# Patient Record
Sex: Male | Born: 1945 | Race: White | Hispanic: No | Marital: Married | Smoking: Never smoker
Health system: Southern US, Community
[De-identification: ages and names within clinical notes are randomized; demographics above are authoritative.]

## PROBLEM LIST (undated history)

## (undated) DIAGNOSIS — R7303 Prediabetes: Secondary | ICD-10-CM

## (undated) DIAGNOSIS — M75100 Unspecified rotator cuff tear or rupture of unspecified shoulder, not specified as traumatic: Secondary | ICD-10-CM

## (undated) DIAGNOSIS — M79643 Pain in unspecified hand: Secondary | ICD-10-CM

## (undated) DIAGNOSIS — M353 Polymyalgia rheumatica: Secondary | ICD-10-CM

## (undated) DIAGNOSIS — I10 Essential (primary) hypertension: Secondary | ICD-10-CM

## (undated) DIAGNOSIS — K0889 Other specified disorders of teeth and supporting structures: Secondary | ICD-10-CM

## (undated) HISTORY — PX: PATELLA FRACTURE SURGERY: SHX735

## (undated) HISTORY — PX: SEPTOPLASTY: SUR1290

## (undated) HISTORY — PX: CATARACT EXTRACTION: SUR2

## (undated) HISTORY — PX: EYE SURGERY: SHX253

---

## 2012-10-24 ENCOUNTER — Ambulatory Visit: Payer: Self-pay | Admitting: Unknown Physician Specialty

## 2012-10-28 LAB — PATHOLOGY REPORT

## 2012-11-12 DIAGNOSIS — H269 Unspecified cataract: Secondary | ICD-10-CM | POA: Insufficient documentation

## 2012-11-12 DIAGNOSIS — H52203 Unspecified astigmatism, bilateral: Secondary | ICD-10-CM | POA: Insufficient documentation

## 2012-12-24 DIAGNOSIS — I10 Essential (primary) hypertension: Secondary | ICD-10-CM | POA: Insufficient documentation

## 2013-10-14 DIAGNOSIS — E785 Hyperlipidemia, unspecified: Secondary | ICD-10-CM | POA: Insufficient documentation

## 2016-10-12 DIAGNOSIS — E119 Type 2 diabetes mellitus without complications: Secondary | ICD-10-CM | POA: Insufficient documentation

## 2017-10-07 ENCOUNTER — Other Ambulatory Visit: Payer: Self-pay | Admitting: Internal Medicine

## 2018-10-12 ENCOUNTER — Other Ambulatory Visit: Payer: Self-pay

## 2018-10-12 DIAGNOSIS — N390 Urinary tract infection, site not specified: Secondary | ICD-10-CM | POA: Insufficient documentation

## 2018-10-12 DIAGNOSIS — I10 Essential (primary) hypertension: Secondary | ICD-10-CM | POA: Diagnosis present

## 2018-10-12 DIAGNOSIS — Z79899 Other long term (current) drug therapy: Secondary | ICD-10-CM | POA: Diagnosis not present

## 2018-10-13 ENCOUNTER — Emergency Department
Admission: EM | Admit: 2018-10-13 | Discharge: 2018-10-13 | Disposition: A | Discharge status: Home / Self Care | Payer: Medicare Other | Attending: Emergency Medicine | Admitting: Emergency Medicine

## 2018-10-13 ENCOUNTER — Other Ambulatory Visit: Payer: Self-pay

## 2018-10-13 ENCOUNTER — Encounter: Payer: Self-pay | Admitting: Emergency Medicine

## 2018-10-13 ENCOUNTER — Emergency Department: Payer: Medicare Other

## 2018-10-13 DIAGNOSIS — I10 Essential (primary) hypertension: Secondary | ICD-10-CM

## 2018-10-13 DIAGNOSIS — N390 Urinary tract infection, site not specified: Secondary | ICD-10-CM

## 2018-10-13 HISTORY — DX: Essential (primary) hypertension: I10

## 2018-10-13 HISTORY — DX: Polymyalgia rheumatica: M35.3

## 2018-10-13 LAB — URINALYSIS, COMPLETE (UACMP) WITH MICROSCOPIC
Bilirubin Urine: NEGATIVE
Glucose, UA: 50 mg/dL — AB
Ketones, ur: NEGATIVE mg/dL
Nitrite: NEGATIVE
Protein, ur: NEGATIVE mg/dL
Specific Gravity, Urine: 1.013 (ref 1.005–1.030)
WBC, UA: >50 WBC/hpf — ABNORMAL HIGH (ref 0–5)
pH: 5 (ref 5.0–8.0)

## 2018-10-13 LAB — TROPONIN I
Troponin I: <0.03 ng/mL (ref <0.03)
Troponin I: <0.03 ng/mL (ref <0.03)

## 2018-10-13 LAB — CBC
HCT: 46.5 % (ref 39.0–52.0)
Hemoglobin: 15 g/dL (ref 13.0–17.0)
MCH: 29 pg (ref 26.0–34.0)
MCHC: 32.3 g/dL (ref 30.0–36.0)
MCV: 89.9 fL (ref 80.0–100.0)
Platelets: 200 10*3/uL (ref 150–400)
RBC: 5.17 MIL/uL (ref 4.22–5.81)
RDW: 13.2 % (ref 11.5–15.5)
WBC: 7.9 10*3/uL (ref 4.0–10.5)
nRBC: 0 % (ref 0.0–0.2)

## 2018-10-13 LAB — BASIC METABOLIC PANEL
Anion gap: 5 (ref 5–15)
BUN: 30 mg/dL — ABNORMAL HIGH (ref 8–23)
CO2: 25 mmol/L (ref 22–32)
Calcium: 8.7 mg/dL — ABNORMAL LOW (ref 8.9–10.3)
Chloride: 110 mmol/L (ref 98–111)
Creatinine, Ser: 0.82 mg/dL (ref 0.61–1.24)
GFR calc Af Amer: >60 mL/min (ref >60)
GFR calc non Af Amer: >60 mL/min (ref >60)
Glucose, Bld: 140 mg/dL — ABNORMAL HIGH (ref 70–99)
Potassium: 4 mmol/L (ref 3.5–5.1)
Sodium: 140 mmol/L (ref 135–145)

## 2018-10-13 MED ORDER — CEPHALEXIN 500 MG PO CAPS
500.0000 mg | ORAL_CAPSULE | Freq: Once | ORAL | Status: AC
  Administered 2018-10-13: 500 mg via ORAL
  Filled 2018-10-13: qty 1

## 2018-10-13 MED ORDER — LOSARTAN POTASSIUM-HCTZ 50-12.5 MG PO TABS: 1.0000 | ORAL_TABLET | Freq: Every day | ORAL | 0 refills | Status: AC

## 2018-10-13 MED ORDER — CEPHALEXIN 500 MG PO CAPS: 500.0000 mg | ORAL_CAPSULE | Freq: Three times a day (TID) | ORAL | 0 refills | Status: DC

## 2018-10-13 NOTE — ED Notes (Signed)

## 2018-10-13 NOTE — ED Notes (Signed)
Pt resting quietly and waiting patiently for treatment room; offered warm blanket but declined

## 2018-10-13 NOTE — Discharge Instructions (Addendum)
1.  Restart Losartan/HCTZ daily. 2.  Take antibiotic as prescribed (Keflex 500 mg 3 times daily x7 days). 3.  Return to the ER for worsening symptoms, persistent vomiting, difficulty breathing or other concerns.

## 2018-10-13 NOTE — ED Provider Notes (Signed)
[facility] Emergency Department Provider Note   ____________________________________________   First MD Initiated Contact with Patient 10/13/18 0451     (approximate)  I have reviewed the triage vital signs and the nursing notes.   HISTORY  Chief Complaint Hypertension and Numbness    HPI Charles Malone is a 73 y.o. male who presents to the ED from home with a chief complaint of elevated blood pressure.  Patient has been on prednisone taper since last fall for polymyalgia rheumatica.  He has intermittent tingling in his hands and joint pain.  He was on losartan/HCTZ but was taken off by his doctor after losing weight.  Patient states he has been putting on weight and took his blood pressure tonight which was 176/95.  States baseline systolic blood pressure in the 130s.  He did find an old blood pressure pill and took 1 along with 81 mg aspirin.  Patient denies headache, vision changes, chest pain, shortness of breath, abdominal pain, nausea or vomiting.  Denies extremity weakness.  Denies recent fever, cough, travel or exposure to persons diagnosed with coronavirus.       Past Medical History:  Diagnosis Date  . Hypertension   . Polymyalgia rheumatica (HCC)     There are no active problems to display for this patient.   Past Surgical History:  Procedure Laterality Date  . CATARACT EXTRACTION Bilateral   . PATELLA FRACTURE SURGERY Left     Prior to Admission medications   Medication Sig Start Date End Date Taking? Authorizing Provider  predniSONE (DELTASONE) 5 MG tablet Take 5 mg by mouth daily with breakfast.   Yes [provider]  losartan-hydrochlorothiazide (HYZAAR) 50-12.5 MG tablet Take 1 tablet by mouth daily. 10/13/18   Irean Hong, MD    Allergies Penicillins  History reviewed. No pertinent family history.  Social History Social History   Tobacco Use  . Smoking status: Never Smoker  . Smokeless tobacco: Never Used   Substance Use Topics  . Alcohol use: Never    Frequency: Never  . Drug use: Never    Review of Systems  Constitutional: No fever/chills Eyes: No visual changes. ENT: No sore throat. Cardiovascular: Denies chest pain. Respiratory: Denies shortness of breath. Gastrointestinal: No abdominal pain.  No nausea, no vomiting.  No diarrhea.  No constipation. Genitourinary: Negative for dysuria. Musculoskeletal: Negative for back pain. Skin: Negative for rash. Neurological: Negative for headaches, focal weakness or numbness. Rheumatological: Positive for occasional hand tingling and joint pain since last fall.   ____________________________________________   PHYSICAL EXAM:  VITAL SIGNS: ED Triage Vitals  Enc Vitals Group     BP 10/13/18 0014 (!) 175/94     Pulse Rate 10/13/18 0014 87     Resp 10/13/18 0014 17     Temp 10/13/18 0233 97.9 F (36.6 C)     Temp Source 10/13/18 0014 Oral     SpO2 10/13/18 0014 96 %     Weight 10/13/18 0016 203 lb (92.1 kg)     Height 10/13/18 0016 5' 8.5" (1.74 m)     Head Circumference --      Peak Flow --      Pain Score 10/13/18 0016 0     Pain Loc --      Pain Edu? --      Excl. in GC? --     Constitutional: Alert and oriented. Well appearing and in no acute distress. Eyes: Conjunctivae are normal. PERRL. EOMI. Head: Atraumatic. Nose:  No congestion/rhinnorhea. Mouth/Throat: Mucous membranes are moist.  Oropharynx non-erythematous. Neck: No stridor.   Cardiovascular: Normal rate, regular rhythm. Grossly normal heart sounds.  Good peripheral circulation. Respiratory: Normal respiratory effort.  No retractions. Lungs CTAB. Gastrointestinal: Soft and nontender. No distention. No abdominal bruits. No CVA tenderness. Musculoskeletal: No lower extremity tenderness nor edema.  No joint effusions. Neurologic:  Normal speech and language. No gross focal neurologic deficits are appreciated. No gait instability. Skin:  Skin is warm, dry and intact.  No rash noted. Psychiatric: Mood and affect are normal. Speech and behavior are normal.  ____________________________________________   LABS (all labs ordered are listed, but only abnormal results are displayed)  Labs Reviewed  BASIC METABOLIC PANEL - Abnormal; Notable for the following components:      Result Value   Glucose, Bld 140 (*)    BUN 30 (*)    Calcium 8.7 (*)    All other components within normal limits  URINALYSIS, COMPLETE (UACMP) WITH MICROSCOPIC - Abnormal; Notable for the following components:   Color, Urine YELLOW (*)    APPearance HAZY (*)    Glucose, UA 50 (*)    Hgb urine dipstick MODERATE (*)    Leukocytes,Ua LARGE (*)    WBC, UA >50 (*)    Bacteria, UA RARE (*)    All other components within normal limits  URINE CULTURE  CBC  TROPONIN I  TROPONIN I   ____________________________________________  EKG  ED ECG REPORT I, , J, the attending physician, personally viewed and interpreted this ECG.   Date: 10/13/2018  EKG Time: 0027  Rate: 76  Rhythm: normal EKG, normal sinus rhythm  Axis: Normal  Intervals:none  ST&T Change: Nonspecific  ____________________________________________  RADIOLOGY  ED MD interpretation: No acute cardiopulmonary process  Official radiology report(s): Dg Chest 2 View  Result Date: 10/13/2018 CLINICAL DATA:  Chest pain EXAM: CHEST - 2 VIEW COMPARISON:  None. FINDINGS: The heart size and mediastinal contours are within normal limits. Both lungs are clear. The visualized skeletal structures are unremarkable. IMPRESSION: No active cardiopulmonary disease. Electronically Signed   By: Deatra RobinsonKevin  Herman M.D.   On: 10/13/2018 01:24    ____________________________________________   PROCEDURES  Procedure(s) performed (including Critical Care):  Procedures   ____________________________________________   INITIAL IMPRESSION / ASSESSMENT AND PLAN / ED COURSE  As part of my medical decision making, I reviewed the  following data within the electronic MEDICAL RECORD NUMBER Nursing notes reviewed and incorporated, Labs reviewed, EKG interpreted, Old chart reviewed, Radiograph reviewed and Notes from prior ED visits     Jaquann R Theresia LoFitch was evaluated in Emergency Department on 10/13/2018 for the symptoms described in the history of present illness. He was evaluated in the context of the global COVID-19 pandemic, which necessitated consideration that the patient might be at risk for infection with the SARS-CoV-2 virus that causes COVID-19. Institutional protocols and algorithms that pertain to the evaluation of patients at risk for COVID-19 are in a state of rapid change based on information released by regulatory bodies including the CDC and federal and state organizations. These policies and algorithms were followed during the patient's care in the ED.   73 year old male who presents with elevated blood pressure.  Has been on prednisone taper since last fall for polymyalgia rheumatica.  Also has been steadily gaining weight.  Likely multifactorial contribution to elevated blood pressure.  Patient is neurologically intact without focal deficits.  Initial troponin EKG unremarkable.  Will repeat troponin, check urinalysis for infection.  Discussed with patient to restart losartan/HCTZ and follow-up closely with his PCP next week.  Clinical Course as of Oct 13 715  Mon Oct 13, 2018  0709 Repeat troponin negative.  Patient reports he has taken Keflex before without adverse reaction.  Will place on Keflex for UTI.  Restart Hyzaar and close follow-up with his PCP.  Strict return precautions even.  Patient verbalizes understanding and agrees with plan of care.   [JS]    Clinical Course User Index [JS] Irean Hong, MD     ____________________________________________   FINAL CLINICAL IMPRESSION(S) / ED DIAGNOSES  Final diagnoses:  Essential hypertension  Urinary tract infection without hematuria, site unspecified      ED Discharge Orders         Ordered    losartan-hydrochlorothiazide (HYZAAR) 50-12.5 MG tablet  Daily     10/13/18 0658           Note:  This document was prepared using Dragon voice recognition software and may include unintentional dictation errors.   Irean Hong, MD 10/13/18 [phone]

## 2018-10-13 NOTE — ED Triage Notes (Addendum)
Pt says he checked his blood pressure at home before bed because he'd been having some tingling in his left arm and both hands; pressure at home was 176/95; denies chest pain and headache; pt says he's been off BP meds for a few years after losing weight; pt says he did find some old Losartan/HCTZ and took one, along with 81mg  aspirin, before coming over;; pt talking in complete coherent sentences;

## 2018-10-13 NOTE — ED Notes (Signed)
Pt updated on delay for lab work turn around time. Pt verbalizes understanding.

## 2018-10-13 NOTE — ED Notes (Signed)
Report to alicia, rn.  

## 2018-10-14 DIAGNOSIS — M353 Polymyalgia rheumatica: Secondary | ICD-10-CM | POA: Insufficient documentation

## 2018-10-15 LAB — URINE CULTURE: Culture: NO GROWTH

## 2018-12-14 DIAGNOSIS — G5603 Carpal tunnel syndrome, bilateral upper limbs: Secondary | ICD-10-CM | POA: Insufficient documentation

## 2018-12-24 ENCOUNTER — Encounter
Admission: RE | Admit: 2018-12-24 | Discharge: 2018-12-24 | Disposition: A | Discharge status: Home / Self Care | Payer: Medicare Other | Source: Ambulatory Visit | Attending: Orthopedic Surgery | Admitting: Orthopedic Surgery

## 2018-12-24 ENCOUNTER — Other Ambulatory Visit: Payer: Self-pay

## 2018-12-24 HISTORY — DX: Pain in unspecified hand: M79.643

## 2018-12-24 HISTORY — DX: Other specified disorders of teeth and supporting structures: K08.89

## 2018-12-24 NOTE — Patient Instructions (Addendum)
Your procedure is scheduled on: 12/29/2018 Mon Report to Same Day Surgery 2nd floor medical mall [facility] Entrance-take elevator on left to 2nd floor.  Check in with surgery information desk.) To find out your arrival time please call [phone] between 1PM - 3PM on 12/26/2018 Fri  Remember: Instructions that are not followed completely may result in serious medical risk, up to and including death, or upon the discretion of your surgeon and anesthesiologist your surgery may need to be rescheduled.    _x___ 1. Do not eat food after midnight the night before your procedure. You may drink clear liquids up to 2 hours before you are scheduled to arrive at the hospital for your procedure.  Do not drink clear liquids within 2 hours of your scheduled arrival to the hospital.  Clear liquids include  --Water or Apple juice without pulp  --Clear carbohydrate beverage such as ClearFast or Gatorade  --Black Coffee or Clear Tea (No milk, no creamers, do not add anything to                  the coffee or Tea Type 1 and type 2 diabetics should only drink water.   ____Ensure clear carbohydrate drink on the way to the hospital for bariatric patients  __x__Ensure clear carbohydrate drink 3 hours before surgery.   No gum chewing or hard candies.     __x__ 2. No Alcohol for 24 hours before or after surgery.   __x__3. No Smoking or e-cigarettes for 24 prior to surgery.  Do not use any chewable tobacco products for at least 6 hour prior to surgery   ____  4. Bring all medications with you on the day of surgery if instructed.    __x__ 5. Notify your doctor if there is any change in your medical condition     (cold, fever, infections).    x___6. On the morning of surgery brush your teeth with toothpaste and water.  You may rinse your mouth with mouth wash if you wish.  Do not swallow any toothpaste or mouthwash.   Do not wear jewelry, make-up, hairpins, clips or nail polish.  Do not wear lotions,  powders, or perfumes. You may wear deodorant.  Do not shave 48 hours prior to surgery. Men may shave face and neck.  Do not bring valuables to the hospital.    [facility] is not responsible for any belongings or valuables.               Contacts, dentures or bridgework may not be worn into surgery.  Leave your suitcase in the car. After surgery it may be brought to your room.  For patients admitted to the hospital, discharge time is determined by your                       treatment team.  _  Patients discharged the day of surgery will not be allowed to drive home.  You will need someone to drive you home and stay with you the night of your procedure.    Please read over the following fact sheets that you were given:   [employer] [facility] [employer] Preparing for Surgery and or MRSA Information   _x___ Take anti-hypertensive listed below, cardiac, seizure, asthma,     anti-reflux and psychiatric medicines. These include:  1. predniSONE (DELTASONE) 1 MG tablet  2.  3.  4.  5.  6.  ____Fleets enema or Magnesium Citrate as directed.   _x___  Use CHG Soap or sage wipes as directed on instruction sheet   ____ Use inhalers on the day of surgery and bring to hospital day of surgery  ____ Stop Metformin and Janumet 2 days prior to surgery.    ____ Take 1/2 of usual insulin dose the night before surgery and none on the morning     surgery.   _x___ Follow recommendations from Cardiologist, Pulmonologist or PCP regarding          stopping Aspirin, Coumadin, Plavix ,Eliquis, Effient, or Pradaxa, and Pletal.  X____Stop Anti-inflammatories such as Advil, Aleve, Ibuprofen, Motrin, Naproxen, Naprosyn, Goodies powders or aspirin products. OK to take Tylenol and                          Celebrex.   _x___ Stop supplements until after surgery.  But may continue Vitamin D, Vitamin B,       and multivitamin.   ____ Bring C-Pap to the hospital.

## 2018-12-25 ENCOUNTER — Other Ambulatory Visit: Payer: Self-pay

## 2018-12-25 ENCOUNTER — Other Ambulatory Visit
Admission: RE | Admit: 2018-12-25 | Discharge: 2018-12-25 | Disposition: A | Discharge status: Home / Self Care | Payer: Medicare Other | Source: Ambulatory Visit | Attending: Orthopedic Surgery | Admitting: Orthopedic Surgery

## 2018-12-25 DIAGNOSIS — Z1159 Encounter for screening for other viral diseases: Secondary | ICD-10-CM | POA: Diagnosis present

## 2018-12-25 LAB — SARS CORONAVIRUS 2 (TAT 6-24 HRS): SARS Coronavirus 2: NEGATIVE

## 2018-12-28 ENCOUNTER — Encounter: Payer: Self-pay | Admitting: Orthopedic Surgery

## 2018-12-29 ENCOUNTER — Ambulatory Visit: Payer: Medicare Other | Admitting: Anesthesiology

## 2018-12-29 ENCOUNTER — Encounter
Admission: RE | Disposition: A | Discharge status: Home / Self Care | Payer: Self-pay | Source: Home / Self Care | Attending: Orthopedic Surgery

## 2018-12-29 ENCOUNTER — Encounter: Payer: Self-pay | Admitting: Anesthesiology

## 2018-12-29 ENCOUNTER — Ambulatory Visit
Admission: RE | Admit: 2018-12-29 | Discharge: 2018-12-29 | Disposition: A | Discharge status: Home / Self Care | Payer: Medicare Other | Attending: Orthopedic Surgery | Admitting: Orthopedic Surgery

## 2018-12-29 ENCOUNTER — Other Ambulatory Visit: Payer: Self-pay

## 2018-12-29 DIAGNOSIS — Z87891 Personal history of nicotine dependence: Secondary | ICD-10-CM | POA: Insufficient documentation

## 2018-12-29 DIAGNOSIS — E119 Type 2 diabetes mellitus without complications: Secondary | ICD-10-CM | POA: Insufficient documentation

## 2018-12-29 DIAGNOSIS — Z79899 Other long term (current) drug therapy: Secondary | ICD-10-CM | POA: Diagnosis not present

## 2018-12-29 DIAGNOSIS — G5603 Carpal tunnel syndrome, bilateral upper limbs: Secondary | ICD-10-CM | POA: Diagnosis present

## 2018-12-29 DIAGNOSIS — I10 Essential (primary) hypertension: Secondary | ICD-10-CM | POA: Diagnosis not present

## 2018-12-29 DIAGNOSIS — Z9889 Other specified postprocedural states: Secondary | ICD-10-CM

## 2018-12-29 HISTORY — PX: CARPAL TUNNEL RELEASE: SHX101

## 2018-12-29 SURGERY — CARPAL TUNNEL RELEASE
Anesthesia: General | Site: Wrist | Laterality: Left

## 2018-12-29 MED ORDER — LACTATED RINGERS IV SOLN
INTRAVENOUS | Status: DC
  Administered 2018-12-29: 17:00:00 via INTRAVENOUS

## 2018-12-29 MED ORDER — FAMOTIDINE 20 MG PO TABS
ORAL_TABLET | ORAL | Status: AC
  Filled 2018-12-29: qty 1

## 2018-12-29 MED ORDER — ONDANSETRON HCL 4 MG/2ML IJ SOLN
INTRAMUSCULAR | Status: DC | PRN
  Administered 2018-12-29: 4 mg via INTRAVENOUS

## 2018-12-29 MED ORDER — NEOMYCIN-POLYMYXIN B GU 40-200000 IR SOLN
Status: AC
  Filled 2018-12-29: qty 2

## 2018-12-29 MED ORDER — HYDROCODONE-ACETAMINOPHEN 5-325 MG PO TABS: 1.0000 | ORAL_TABLET | ORAL | 0 refills | Status: AC | PRN

## 2018-12-29 MED ORDER — FENTANYL CITRATE (PF) 100 MCG/2ML IJ SOLN
INTRAMUSCULAR | Status: AC
  Filled 2018-12-29: qty 2

## 2018-12-29 MED ORDER — FENTANYL CITRATE (PF) 100 MCG/2ML IJ SOLN: 25.0000 ug | INTRAMUSCULAR | Status: DC | PRN

## 2018-12-29 MED ORDER — PROPOFOL 10 MG/ML IV BOLUS
INTRAVENOUS | Status: AC
  Filled 2018-12-29: qty 20

## 2018-12-29 MED ORDER — FENTANYL CITRATE (PF) 100 MCG/2ML IJ SOLN
INTRAMUSCULAR | Status: DC | PRN
  Administered 2018-12-29 (×2): 50 ug via INTRAVENOUS

## 2018-12-29 MED ORDER — ONDANSETRON HCL 4 MG/2ML IJ SOLN: 4.0000 mg | Freq: Once | INTRAMUSCULAR | Status: DC | PRN

## 2018-12-29 MED ORDER — ACETAMINOPHEN 10 MG/ML IV SOLN
INTRAVENOUS | Status: DC | PRN
  Administered 2018-12-29: 1000 mg via INTRAVENOUS

## 2018-12-29 MED ORDER — CELECOXIB 200 MG PO CAPS
ORAL_CAPSULE | ORAL | Status: AC
  Filled 2018-12-29: qty 2

## 2018-12-29 MED ORDER — BUPIVACAINE HCL (PF) 0.25 % IJ SOLN
INTRAMUSCULAR | Status: DC | PRN
  Administered 2018-12-29: 10 mL

## 2018-12-29 MED ORDER — EPHEDRINE SULFATE 50 MG/ML IJ SOLN
INTRAMUSCULAR | Status: DC | PRN
  Administered 2018-12-29 (×2): 5 mg via INTRAVENOUS

## 2018-12-29 MED ORDER — HYDROCODONE-ACETAMINOPHEN 5-325 MG PO TABS
ORAL_TABLET | ORAL | Status: AC
  Filled 2018-12-29: qty 2

## 2018-12-29 MED ORDER — PROPOFOL 10 MG/ML IV BOLUS
INTRAVENOUS | Status: DC | PRN
  Administered 2018-12-29: 150 mg via INTRAVENOUS

## 2018-12-29 MED ORDER — CHLORHEXIDINE GLUCONATE 4 % EX LIQD: 60.0000 mL | Freq: Once | CUTANEOUS | Status: DC

## 2018-12-29 MED ORDER — LIDOCAINE HCL (CARDIAC) PF 100 MG/5ML IV SOSY
PREFILLED_SYRINGE | INTRAVENOUS | Status: DC | PRN
  Administered 2018-12-29: 100 mg via INTRAVENOUS

## 2018-12-29 MED ORDER — FAMOTIDINE 20 MG PO TABS
20.0000 mg | ORAL_TABLET | Freq: Once | ORAL | Status: AC
  Administered 2018-12-29: 16:00:00 20 mg via ORAL

## 2018-12-29 MED ORDER — NEOMYCIN-POLYMYXIN B GU 40-200000 IR SOLN
Status: DC | PRN
  Administered 2018-12-29: 2 mL

## 2018-12-29 MED ORDER — BUPIVACAINE HCL (PF) 0.25 % IJ SOLN
INTRAMUSCULAR | Status: AC
  Filled 2018-12-29: qty 30

## 2018-12-29 MED ORDER — HYDROCODONE-ACETAMINOPHEN 5-325 MG PO TABS
1.0000 | ORAL_TABLET | Freq: Once | ORAL | Status: AC
  Administered 2018-12-29: 2 via ORAL

## 2018-12-29 MED ORDER — CELECOXIB 200 MG PO CAPS
400.0000 mg | ORAL_CAPSULE | Freq: Once | ORAL | Status: AC
  Administered 2018-12-29: 400 mg via ORAL

## 2018-12-29 MED ORDER — PHENYLEPHRINE HCL (PRESSORS) 10 MG/ML IV SOLN
INTRAVENOUS | Status: DC | PRN
  Administered 2018-12-29: 200 ug via INTRAVENOUS

## 2018-12-29 MED ORDER — ACETAMINOPHEN 10 MG/ML IV SOLN
INTRAVENOUS | Status: AC
  Filled 2018-12-29: qty 100

## 2018-12-29 SURGICAL SUPPLY — 26 items
BNDG ELASTIC 3X5.8 VLCR NS LF (GAUZE/BANDAGES/DRESSINGS) ×2 IMPLANT
BNDG ESMARK 4X12 TAN STRL LF (GAUZE/BANDAGES/DRESSINGS) ×2 IMPLANT
CANISTER SUCT 1200ML W/VALVE (MISCELLANEOUS) ×2 IMPLANT
CAST PADDING 3X4FT ST 30246 (SOFTGOODS) ×1
COVER WAND RF STERILE (DRAPES) ×2 IMPLANT
CUFF TOURN SGL QUICK 18X4 (TOURNIQUET CUFF) ×2 IMPLANT
DRSG DERMACEA 8X12 NADH (GAUZE/BANDAGES/DRESSINGS) ×2 IMPLANT
DURAPREP 26ML APPLICATOR (WOUND CARE) ×2 IMPLANT
ELECT CAUTERY BLADE 6.4 (BLADE) ×2 IMPLANT
ELECT REM PT RETURN 9FT ADLT (ELECTROSURGICAL) ×2
ELECTRODE REM PT RTRN 9FT ADLT (ELECTROSURGICAL) ×1 IMPLANT
GAUZE SPONGE 4X4 12PLY STRL (GAUZE/BANDAGES/DRESSINGS) ×2 IMPLANT
GLOVE BIOGEL M STRL SZ7.5 (GLOVE) ×2 IMPLANT
GLOVE INDICATOR 8.0 STRL GRN (GLOVE) ×2 IMPLANT
GOWN STRL REUS W/ TWL LRG LVL3 (GOWN DISPOSABLE) ×2 IMPLANT
GOWN STRL REUS W/TWL LRG LVL3 (GOWN DISPOSABLE) ×2
KIT TURNOVER KIT A (KITS) ×2 IMPLANT
NS IRRIG 500ML POUR BTL (IV SOLUTION) ×2 IMPLANT
PACK EXTREMITY ARMC (MISCELLANEOUS) ×2 IMPLANT
PAD CAST CTTN 3X4 STRL (SOFTGOODS) ×1 IMPLANT
SOL PREP PVP 2OZ (MISCELLANEOUS) ×2
SOLUTION PREP PVP 2OZ (MISCELLANEOUS) ×1 IMPLANT
SPLINT CAST 1 STEP 3X12 (MISCELLANEOUS) ×2 IMPLANT
STOCKINETTE 48X4 2 PLY STRL (GAUZE/BANDAGES/DRESSINGS) ×1 IMPLANT
STOCKINETTE STRL 4IN 9604848 (GAUZE/BANDAGES/DRESSINGS) ×2 IMPLANT
SUT ETHILON 5-0 FS-2 18 BLK (SUTURE) ×2 IMPLANT

## 2018-12-29 NOTE — Anesthesia Postprocedure Evaluation (Signed)
Anesthesia Post Note  Patient: Charles Malone  Procedure(s) Performed: CARPAL TUNNEL RELEASE (Left Wrist)  Patient location during evaluation: PACU Anesthesia Type: General Level of consciousness: awake and alert Pain management: pain level controlled Vital Signs Assessment: post-procedure vital signs reviewed and stable Respiratory status: spontaneous breathing, nonlabored ventilation, respiratory function stable and patient connected to nasal cannula oxygen Cardiovascular status: blood pressure returned to baseline and stable Postop Assessment: no apparent nausea or vomiting Anesthetic complications: no     Last Vitals:  Vitals:   12/29/18 1858 12/29/18 1919  BP: (!) 168/77 (!) 167/74  Pulse: 75 74  Resp: 15 15  Temp: 36.8 C 36.4 C  SpO2: 95% 95%    Last Pain:  Vitals:   12/29/18 1919  TempSrc:   PainSc: 4                  Martha Clan

## 2018-12-29 NOTE — Op Note (Signed)
OPERATIVE NOTE  DATE OF SURGERY:  12/29/2018  PATIENT NAME:  Charles Malone   DOB: 10-03-1945  MRN: [id number]  PRE-OPERATIVE DIAGNOSIS: Left carpal tunnel syndrome  POST-OPERATIVE DIAGNOSIS:  Same  PROCEDURE:  Left carpal tunnel release  SURGEON:  Marciano Sequin. M.D.  ANESTHESIA: general  ESTIMATED BLOOD LOSS: Minimal  FLUIDS REPLACED: 500 mL of crystalloid  TOURNIQUET TIME: 32 minutes  DRAINS: None  INDICATIONS FOR SURGERY: VERYL WINEMILLER is a 73 y.o. year old male with a long history of numbness and paresthesias to both hands. EMG/nerve conduction studies demonstrated findings consistent with carpal tunnel syndrome.The patient had not seen any significant improvement despite conservative nonsurgical intervention. After discussion of the risks and benefits of surgical intervention, the patient expressed understanding of the risks benefits and agree with plans for carpal tunnel release.   PROCEDURE IN DETAIL: The patient was brought into the operating room and after adequate general anesthesia, a tourniquet was placed on the patient's left upper arm.The left hand and arm were prepped with alcohol and Duraprep and draped in the usual sterile fashion. A "time-out" was performed as per usual protocol. The hand and forearm were exsanguinated using an Esmarch and the tourniquet was inflated to 250 mmHg. Loupe magnification was used throughout the procedure. An incision was made just ulnar to the thenar palmar crease. Dissection was carried down through the palmar fascia to the transverse carpal ligament. The transverse carpal ligament was sharply incised, taking care to protect the underlying structures with the carpal tunnel. Complete release of the transverse carpal ligament was achieved. There was no evidence of ganglion cyst or lipoma within the carpal tunnel. The wound was irrigated with copious amounts of normal saline with antibiotic solution. The skin was then re-approximated with  interrupted sutures of #5-0 nylon. A sterile dressing was applied followed by application of a volar splint. The tourniquet was deflated with a total tourniquet time of 32 minutes.  The patient tolerated the procedure well and was transported to the PACU in stable condition.  James P. Holley Bouche., M.D.

## 2018-12-29 NOTE — Anesthesia Post-op Follow-up Note (Signed)
Anesthesia QCDR form completed.        

## 2018-12-29 NOTE — Transfer of Care (Signed)
Immediate Anesthesia Transfer of Care Note  Patient: Charles Malone  Procedure(s) Performed: CARPAL TUNNEL RELEASE (Left Wrist)  Patient Location: PACU  Anesthesia Type:General  Level of Consciousness: sedated  Airway & Oxygen Therapy: Patient Spontanous Breathing and Patient connected to face mask oxygen  Post-op Assessment: Report given to RN and Post -op Vital signs reviewed and stable  Post vital signs: Reviewed and stable  Last Vitals:  Vitals Value Taken Time  BP 161/65 12/29/18 1813  Temp    Pulse 83 12/29/18 1818  Resp 16 12/29/18 1813  SpO2 100 % 12/29/18 1818  Vitals shown include unvalidated device data.  Last Pain:  Vitals:   12/29/18 1813  TempSrc:   PainSc: Asleep         Complications: No apparent anesthesia complications

## 2018-12-29 NOTE — Discharge Instructions (Signed)
°  Instructions after Hand / Wrist Surgery ° ° James P. Hooten, Jr., M.D. ° Dept. of Orthopaedics & Sports Medicine ° [facility] ° [address] ° [place], [place]  [address] ° ° Phone: [phone]   Fax: [phone] ° ° °DIET: °• Drink plenty of non-alcoholic fluids & begin a light diet. °• Resume your normal diet the day after surgery. ° °ACTIVITY:  °• Keep the hand elevated above the level of the elbow. °• Begin gently moving the fingers on a regular basis to avoid stiffness. °• Avoid any heavy lifting, pushing, or pulling with the operative hand. °• Do not drive or operate any equipment until instructed. ° °WOUND CARE:  °• Keep the splint/bandage clean and dry.  °• The splint and stitches will be removed in the office. °• Continue to use the ice packs periodically to reduce pain and swelling. °• You may bathe or shower after the stitches are removed at the first office visit following surgery. ° °MEDICATIONS: °• You may resume your regular medications. °• Please take the pain medication as prescribed. °• Do not take pain medication on an empty stomach. °• Do not drive or drink alcoholic beverages when taking pain medications. ° °CALL THE OFFICE FOR: °• Temperature above 101 degrees °• Excessive bleeding or drainage on the dressing. °• Excessive swelling, coldness, or paleness of the fingers. °• Persistent nausea and vomiting. ° °FOLLOW-UP:  °• You should have an appointment to return to the office in 7-10 days after surgery.  ° °REMEMBER: R.I.C.E. = Rest, Ice, Compression, Elevation !  ° ° ° °AMBULATORY SURGERY  °DISCHARGE INSTRUCTIONS ° ° °1) The drugs that you were given will stay in your system until tomorrow so for the next 24 hours you should not: ° °A) Drive an automobile °B) Make any legal decisions °C) Drink any alcoholic beverage ° ° °2) You may resume regular meals tomorrow.  Today it is better to start with liquids and gradually work up to solid foods. ° °You may eat anything you prefer, but  it is better to start with liquids, then soup and crackers, and gradually work up to solid foods. ° ° °3) Please notify your doctor immediately if you have any unusual bleeding, trouble breathing, redness and pain at the surgery site, drainage, fever, or pain not relieved by medication. ° ° ° °4) Additional Instructions: ° ° ° ° ° ° ° °Please contact your physician with any problems or Same Day Surgery at [phone], Monday through Friday 6 am to 4 pm, or  at [place] Main number at [phone]. °

## 2018-12-29 NOTE — Anesthesia Procedure Notes (Signed)
Procedure Name: LMA Insertion Date/Time: 12/29/2018 5:11 PM Performed by: Leander Rams, CRNA Pre-anesthesia Checklist: Patient identified, Emergency Drugs available, Suction available, Patient being monitored and Timeout performed Patient Re-evaluated:Patient Re-evaluated prior to induction Oxygen Delivery Method: Circle system utilized Preoxygenation: Pre-oxygenation with 100% oxygen Induction Type: IV induction Ventilation: Mask ventilation without difficulty LMA: LMA inserted LMA Size: 4.0 Number of attempts: 1 Tube secured with: Tape

## 2018-12-29 NOTE — Anesthesia Preprocedure Evaluation (Signed)
Anesthesia Evaluation  Patient identified by MRN, date of birth, ID band Patient awake    Reviewed: Allergy & Precautions, H&P , NPO status , Patient's Chart, lab work & pertinent test results, reviewed documented beta blocker date and time   History of Anesthesia Complications Negative for: history of anesthetic complications  Airway Mallampati: I  TM Distance: >3 FB Neck ROM: full    Dental  (+) Caps, Dental Advidsory Given, Teeth Intact   Pulmonary neg pulmonary ROS,    Pulmonary exam normal        Cardiovascular Exercise Tolerance: Good hypertension, (-) angina(-) Past MI and (-) Cardiac Stents Normal cardiovascular exam(-) dysrhythmias (-) Valvular Problems/Murmurs     Neuro/Psych negative neurological ROS  negative psych ROS   GI/Hepatic negative GI ROS, Neg liver ROS,   Endo/Other  diabetes (borderline), Well Controlled  Renal/GU negative Renal ROS  negative genitourinary   Musculoskeletal   Abdominal   Peds  Hematology negative hematology ROS (+)   Anesthesia Other Findings Past Medical History: No date: Hand pain No date: Hypertension No date: Polymyalgia rheumatica (HCC) No date: Toothache     Comment:  Root canal on 12/25/2018   Reproductive/Obstetrics negative OB ROS                             Anesthesia Physical Anesthesia Plan  ASA: II  Anesthesia Plan: General   Post-op Pain Management:    Induction: Intravenous  PONV Risk Score and Plan: 2 and Ondansetron, Dexamethasone and Treatment may vary due to age or medical condition  Airway Management Planned: LMA  Additional Equipment:   Intra-op Plan:   Post-operative Plan: Extubation in OR  Informed Consent: I have reviewed the patients History and Physical, chart, labs and discussed the procedure including the risks, benefits and alternatives for the proposed anesthesia with the patient or authorized  representative who has indicated his/her understanding and acceptance.     Dental Advisory Given  Plan Discussed with: Anesthesiologist, CRNA and Surgeon  Anesthesia Plan Comments:         Anesthesia Quick Evaluation

## 2018-12-29 NOTE — H&P (Signed)
The patient has been re-examined, and the chart reviewed, and there have been no interval changes to the documented history and physical.    The risks, benefits, and alternatives have been discussed at length. The patient expressed understanding of the risks benefits and agreed with plans for surgical intervention.  Jorma Tassinari P. Cassandra Mcmanaman, Jr. M.D.    

## 2018-12-30 ENCOUNTER — Encounter: Payer: Self-pay | Admitting: Orthopedic Surgery

## 2019-02-16 ENCOUNTER — Other Ambulatory Visit: Payer: Self-pay

## 2019-02-16 ENCOUNTER — Encounter
Admission: RE | Admit: 2019-02-16 | Discharge: 2019-02-16 | Disposition: A | Discharge status: Home / Self Care | Payer: Medicare Other | Source: Ambulatory Visit | Attending: Orthopedic Surgery | Admitting: Orthopedic Surgery

## 2019-02-16 DIAGNOSIS — Z01818 Encounter for other preprocedural examination: Secondary | ICD-10-CM | POA: Insufficient documentation

## 2019-02-16 HISTORY — DX: Unspecified rotator cuff tear or rupture of unspecified shoulder, not specified as traumatic: M75.100

## 2019-02-16 HISTORY — DX: Prediabetes: R73.03

## 2019-02-16 NOTE — Patient Instructions (Signed)
Your procedure is scheduled on: Mon 9/21 Report to Day Surgery. To find out your arrival time please call [phone] between 1PM - 3PM on Friday 9/18.  Remember: Instructions that are not followed completely may result in serious medical risk,  up to and including death, or upon the discretion of your surgeon and anesthesiologist your  surgery may need to be rescheduled.     _X__ 1. Do not eat food after midnight the night before your procedure.                 No gum chewing or hard candies. You may drink clear liquids up to 2 hours                 before you are scheduled to arrive for your surgery- DO not drink clear                 liquids within 2 hours of the start of your surgery.                 Clear Liquids include:  water, apple juice without pulp, clear carbohydrate                 drink such as Clearfast of Gatorade, Black Coffee or Tea (Do not add                 anything to coffee or tea).  __X__2.  On the morning of surgery brush your teeth with toothpaste and water, you                may rinse your mouth with mouthwash if you wish.  Do not swallow any toothpaste of mouthwash.     __ 3.  No Alcohol for 24 hours before or after surgery.   ___ 4.  Do Not Smoke or use e-cigarettes For 24 Hours Prior to Your Surgery.                 Do not use any chewable tobacco products for at least 6 hours prior to                 surgery.  ____  5.  Bring all medications with you on the day of surgery if instructed.   _x___  6.  Notify your doctor if there is any change in your medical condition      (cold, fever, infections).     Do not wear jewelry, make-up, hairpins, clips or nail polish. Do not wear lotions, powders, or perfumes. You may wear deodorant. Do not shave 48 hours prior to surgery. Men may shave face and neck. Do not bring valuables to the hospital.    [facility] is not responsible for any belongings or valuables.  Contacts, dentures or  bridgework may not be worn into surgery. Leave your suitcase in the car. After surgery it may be brought to your room. For patients admitted to the hospital, discharge time is determined by your treatment team.   Patients discharged the day of surgery will not be allowed to drive home.   Please read over the following fact sheets that you were given:     __x__ Take these medicines the morning of surgery with A SIP OF WATER:    1. none  2.   3.   4.  5.  6.  ____ Fleet Enema (as directed)   _x___ Use CHG Soap as directed  ____ Use inhalers on the day  of surgery  ____ Stop metformin 2 days prior to surgery    ____ Take 1/2 of usual insulin dose the night before surgery. No insulin the morning          of surgery.   ____ Stop Coumadin/Plavix/aspirin on   ____ Stop Anti-inflammatories on    ____ Stop supplements until after surgery.    ____ Bring C-Pap to the hospital.

## 2019-02-19 ENCOUNTER — Other Ambulatory Visit
Admission: RE | Admit: 2019-02-19 | Discharge: 2019-02-19 | Disposition: A | Discharge status: Home / Self Care | Payer: Medicare Other | Source: Ambulatory Visit | Attending: Orthopedic Surgery | Admitting: Orthopedic Surgery

## 2019-02-19 ENCOUNTER — Other Ambulatory Visit: Payer: Self-pay

## 2019-02-19 DIAGNOSIS — Z20828 Contact with and (suspected) exposure to other viral communicable diseases: Secondary | ICD-10-CM | POA: Diagnosis not present

## 2019-02-19 DIAGNOSIS — Z01812 Encounter for preprocedural laboratory examination: Secondary | ICD-10-CM | POA: Diagnosis present

## 2019-02-19 LAB — SARS CORONAVIRUS 2 (TAT 6-24 HRS): SARS Coronavirus 2: NEGATIVE

## 2019-02-23 ENCOUNTER — Ambulatory Visit: Payer: Medicare Other | Admitting: Anesthesiology

## 2019-02-23 ENCOUNTER — Ambulatory Visit
Admission: RE | Admit: 2019-02-23 | Discharge: 2019-02-23 | Disposition: A | Discharge status: Home / Self Care | Payer: Medicare Other | Attending: Orthopedic Surgery | Admitting: Orthopedic Surgery

## 2019-02-23 ENCOUNTER — Other Ambulatory Visit: Payer: Self-pay

## 2019-02-23 ENCOUNTER — Encounter: Payer: Self-pay | Admitting: Orthopedic Surgery

## 2019-02-23 ENCOUNTER — Encounter
Admission: RE | Disposition: A | Discharge status: Home / Self Care | Payer: Self-pay | Source: Home / Self Care | Attending: Orthopedic Surgery

## 2019-02-23 DIAGNOSIS — Z7952 Long term (current) use of systemic steroids: Secondary | ICD-10-CM | POA: Insufficient documentation

## 2019-02-23 DIAGNOSIS — G5603 Carpal tunnel syndrome, bilateral upper limbs: Secondary | ICD-10-CM

## 2019-02-23 DIAGNOSIS — Z87891 Personal history of nicotine dependence: Secondary | ICD-10-CM | POA: Diagnosis not present

## 2019-02-23 DIAGNOSIS — E119 Type 2 diabetes mellitus without complications: Secondary | ICD-10-CM | POA: Insufficient documentation

## 2019-02-23 DIAGNOSIS — G5601 Carpal tunnel syndrome, right upper limb: Secondary | ICD-10-CM | POA: Insufficient documentation

## 2019-02-23 DIAGNOSIS — I10 Essential (primary) hypertension: Secondary | ICD-10-CM | POA: Insufficient documentation

## 2019-02-23 HISTORY — PX: CARPAL TUNNEL RELEASE: SHX101

## 2019-02-23 SURGERY — CARPAL TUNNEL RELEASE
Anesthesia: General | Site: Wrist | Laterality: Right

## 2019-02-23 MED ORDER — FAMOTIDINE 20 MG PO TABS
ORAL_TABLET | ORAL | Status: AC
  Filled 2019-02-23: qty 1

## 2019-02-23 MED ORDER — GLYCOPYRROLATE 0.2 MG/ML IJ SOLN
INTRAMUSCULAR | Status: AC
  Filled 2019-02-23: qty 1

## 2019-02-23 MED ORDER — OXYCODONE HCL 5 MG PO TABS: 5.0000 mg | ORAL_TABLET | Freq: Once | ORAL | Status: DC | PRN

## 2019-02-23 MED ORDER — CELECOXIB 200 MG PO CAPS
ORAL_CAPSULE | ORAL | Status: AC
  Filled 2019-02-23: qty 2

## 2019-02-23 MED ORDER — PROPOFOL 10 MG/ML IV BOLUS
INTRAVENOUS | Status: DC | PRN
  Administered 2019-02-23: 150 mg via INTRAVENOUS

## 2019-02-23 MED ORDER — CHLORHEXIDINE GLUCONATE 4 % EX LIQD: 60.0000 mL | Freq: Once | CUTANEOUS | Status: DC

## 2019-02-23 MED ORDER — ONDANSETRON HCL 4 MG/2ML IJ SOLN
INTRAMUSCULAR | Status: DC | PRN
  Administered 2019-02-23: 4 mg via INTRAVENOUS

## 2019-02-23 MED ORDER — OXYCODONE HCL 5 MG/5ML PO SOLN: 5.0000 mg | Freq: Once | ORAL | Status: DC | PRN

## 2019-02-23 MED ORDER — DEXAMETHASONE SODIUM PHOSPHATE 10 MG/ML IJ SOLN
INTRAMUSCULAR | Status: AC
  Filled 2019-02-23: qty 1

## 2019-02-23 MED ORDER — METOCLOPRAMIDE HCL 10 MG PO TABS: 5.0000 mg | ORAL_TABLET | Freq: Three times a day (TID) | ORAL | Status: DC | PRN

## 2019-02-23 MED ORDER — DEXAMETHASONE SODIUM PHOSPHATE 10 MG/ML IJ SOLN
INTRAMUSCULAR | Status: DC | PRN
  Administered 2019-02-23: 5 mg via INTRAVENOUS

## 2019-02-23 MED ORDER — PHENYLEPHRINE HCL (PRESSORS) 10 MG/ML IV SOLN
INTRAVENOUS | Status: AC
  Filled 2019-02-23: qty 1

## 2019-02-23 MED ORDER — LACTATED RINGERS IV SOLN
INTRAVENOUS | Status: DC
  Administered 2019-02-23 (×2): via INTRAVENOUS

## 2019-02-23 MED ORDER — NEOMYCIN-POLYMYXIN B GU 40-200000 IR SOLN
Status: DC | PRN
  Administered 2019-02-23: 2 mL

## 2019-02-23 MED ORDER — LIDOCAINE HCL (PF) 2 % IJ SOLN
INTRAMUSCULAR | Status: AC
  Filled 2019-02-23: qty 10

## 2019-02-23 MED ORDER — METOCLOPRAMIDE HCL 5 MG/ML IJ SOLN: 5.0000 mg | Freq: Three times a day (TID) | INTRAMUSCULAR | Status: DC | PRN

## 2019-02-23 MED ORDER — ONDANSETRON HCL 4 MG/2ML IJ SOLN
INTRAMUSCULAR | Status: AC
  Filled 2019-02-23: qty 2

## 2019-02-23 MED ORDER — ACETAMINOPHEN 10 MG/ML IV SOLN
INTRAVENOUS | Status: DC | PRN
  Administered 2019-02-23: 1000 mg via INTRAVENOUS

## 2019-02-23 MED ORDER — LIDOCAINE HCL (CARDIAC) PF 100 MG/5ML IV SOSY
PREFILLED_SYRINGE | INTRAVENOUS | Status: DC | PRN
  Administered 2019-02-23: 60 mg via INTRAVENOUS

## 2019-02-23 MED ORDER — ONDANSETRON HCL 4 MG PO TABS: 4.0000 mg | ORAL_TABLET | Freq: Four times a day (QID) | ORAL | Status: DC | PRN

## 2019-02-23 MED ORDER — FENTANYL CITRATE (PF) 100 MCG/2ML IJ SOLN
INTRAMUSCULAR | Status: DC | PRN
  Administered 2019-02-23 (×4): 25 ug via INTRAVENOUS

## 2019-02-23 MED ORDER — ONDANSETRON HCL 4 MG/2ML IJ SOLN: 4.0000 mg | Freq: Four times a day (QID) | INTRAMUSCULAR | Status: DC | PRN

## 2019-02-23 MED ORDER — PHENYLEPHRINE HCL (PRESSORS) 10 MG/ML IV SOLN
INTRAVENOUS | Status: DC | PRN
  Administered 2019-02-23 (×2): 100 ug via INTRAVENOUS
  Administered 2019-02-23: 150 ug via INTRAVENOUS
  Administered 2019-02-23: 100 ug via INTRAVENOUS

## 2019-02-23 MED ORDER — FENTANYL CITRATE (PF) 100 MCG/2ML IJ SOLN: 25.0000 ug | INTRAMUSCULAR | Status: DC | PRN

## 2019-02-23 MED ORDER — BUPIVACAINE HCL (PF) 0.25 % IJ SOLN
INTRAMUSCULAR | Status: DC | PRN
  Administered 2019-02-23: 10 mL

## 2019-02-23 MED ORDER — CELECOXIB 200 MG PO CAPS
400.0000 mg | ORAL_CAPSULE | Freq: Once | ORAL | Status: AC
  Administered 2019-02-23: 400 mg via ORAL

## 2019-02-23 MED ORDER — PROPOFOL 10 MG/ML IV BOLUS
INTRAVENOUS | Status: AC
  Filled 2019-02-23: qty 20

## 2019-02-23 MED ORDER — GLYCOPYRROLATE 0.2 MG/ML IJ SOLN
INTRAMUSCULAR | Status: DC | PRN
  Administered 2019-02-23: 0.2 mg via INTRAVENOUS

## 2019-02-23 MED ORDER — FAMOTIDINE 20 MG PO TABS
20.0000 mg | ORAL_TABLET | Freq: Once | ORAL | Status: AC
  Administered 2019-02-23: 15:00:00 20 mg via ORAL

## 2019-02-23 MED ORDER — FENTANYL CITRATE (PF) 100 MCG/2ML IJ SOLN
INTRAMUSCULAR | Status: AC
  Filled 2019-02-23: qty 2

## 2019-02-23 SURGICAL SUPPLY — 26 items
BNDG ELASTIC 3X5.8 VLCR NS LF (GAUZE/BANDAGES/DRESSINGS) ×3 IMPLANT
BNDG ESMARK 4X12 TAN STRL LF (GAUZE/BANDAGES/DRESSINGS) ×3 IMPLANT
CANISTER SUCT 1200ML W/VALVE (MISCELLANEOUS) ×3 IMPLANT
CAST PADDING 3X4FT ST 30246 (SOFTGOODS) ×2
COVER WAND RF STERILE (DRAPES) ×3 IMPLANT
CUFF TOURN SGL QUICK 18X4 (TOURNIQUET CUFF) ×3 IMPLANT
DRSG DERMACEA 8X12 NADH (GAUZE/BANDAGES/DRESSINGS) ×3 IMPLANT
DURAPREP 26ML APPLICATOR (WOUND CARE) ×3 IMPLANT
ELECT CAUTERY BLADE 6.4 (BLADE) ×3 IMPLANT
ELECT REM PT RETURN 9FT ADLT (ELECTROSURGICAL) ×3
ELECTRODE REM PT RTRN 9FT ADLT (ELECTROSURGICAL) ×1 IMPLANT
GAUZE SPONGE 4X4 12PLY STRL (GAUZE/BANDAGES/DRESSINGS) ×3 IMPLANT
GLOVE BIOGEL M STRL SZ7.5 (GLOVE) ×3 IMPLANT
GLOVE INDICATOR 8.0 STRL GRN (GLOVE) ×3 IMPLANT
GOWN STRL REUS W/ TWL LRG LVL3 (GOWN DISPOSABLE) ×2 IMPLANT
GOWN STRL REUS W/TWL LRG LVL3 (GOWN DISPOSABLE) ×4
KIT TURNOVER KIT A (KITS) ×3 IMPLANT
NS IRRIG 500ML POUR BTL (IV SOLUTION) ×3 IMPLANT
PACK EXTREMITY ARMC (MISCELLANEOUS) ×3 IMPLANT
PAD CAST CTTN 3X4 STRL (SOFTGOODS) ×1 IMPLANT
SOL PREP PVP 2OZ (MISCELLANEOUS) ×3
SOLUTION PREP PVP 2OZ (MISCELLANEOUS) ×1 IMPLANT
SPLINT CAST 1 STEP 3X12 (MISCELLANEOUS) ×3 IMPLANT
STOCKINETTE 48X4 2 PLY STRL (GAUZE/BANDAGES/DRESSINGS) ×1 IMPLANT
STOCKINETTE STRL 4IN 9604848 (GAUZE/BANDAGES/DRESSINGS) ×3 IMPLANT
SUT ETHILON 5-0 FS-2 18 BLK (SUTURE) ×3 IMPLANT

## 2019-02-23 NOTE — Discharge Instructions (Signed)
°  Instructions after Hand / Wrist Surgery ° ° James P. Hooten, Jr., M.D. ° Dept. of Orthopaedics & Sports Medicine ° [facility] ° [address] ° [place], [place]  [address] ° ° Phone: [phone]   Fax: [phone] ° ° °DIET: °• Drink plenty of non-alcoholic fluids & begin a light diet. °• Resume your normal diet the day after surgery. ° °ACTIVITY:  °• Keep the hand elevated above the level of the elbow. °• Begin gently moving the fingers on a regular basis to avoid stiffness. °• Avoid any heavy lifting, pushing, or pulling with the operative hand. °• Do not drive or operate any equipment until instructed. ° °WOUND CARE:  °• Keep the splint/bandage clean and dry.  °• The splint and stitches will be removed in the office. °• Continue to use the ice packs periodically to reduce pain and swelling. °• You may bathe or shower after the stitches are removed at the first office visit following surgery. ° °MEDICATIONS: °• You may resume your regular medications. °• Please take the pain medication as prescribed. °• Do not take pain medication on an empty stomach. °• Do not drive or drink alcoholic beverages when taking pain medications. ° °CALL THE OFFICE FOR: °• Temperature above 101 degrees °• Excessive bleeding or drainage on the dressing. °• Excessive swelling, coldness, or paleness of the fingers. °• Persistent nausea and vomiting. ° °FOLLOW-UP:  °• You should have an appointment to return to the office in 7-10 days after surgery.  ° °REMEMBER: R.I.C.E. = Rest, Ice, Compression, Elevation !  ° ° ° °AMBULATORY SURGERY  °DISCHARGE INSTRUCTIONS ° ° °1) The drugs that you were given will stay in your system until tomorrow so for the next 24 hours you should not: ° °A) Drive an automobile °B) Make any legal decisions °C) Drink any alcoholic beverage ° ° °2) You may resume regular meals tomorrow.  Today it is better to start with liquids and gradually work up to solid foods. ° °You may eat anything you prefer, but  it is better to start with liquids, then soup and crackers, and gradually work up to solid foods. ° ° °3) Please notify your doctor immediately if you have any unusual bleeding, trouble breathing, redness and pain at the surgery site, drainage, fever, or pain not relieved by medication. ° ° ° °4) Additional Instructions: ° ° ° ° ° ° ° °Please contact your physician with any problems or Same Day Surgery at [phone], Monday through Friday 6 am to 4 pm, or [place] at  Main number at [phone]. °

## 2019-02-23 NOTE — Anesthesia Post-op Follow-up Note (Signed)
Anesthesia QCDR form completed.        

## 2019-02-23 NOTE — Op Note (Signed)
OPERATIVE NOTE  DATE OF SURGERY:  02/23/2019  PATIENT NAME:  Charles Malone   DOB: [birth year]  MRN: [id number]  PRE-OPERATIVE DIAGNOSIS: Right carpal tunnel syndrome  POST-OPERATIVE DIAGNOSIS:  Same  PROCEDURE:  Right carpal tunnel release  SURGEON:  Marciano Sequin. M.D.  ANESTHESIA: general  ESTIMATED BLOOD LOSS: Minimal  FLUIDS REPLACED: 1000 mL of crystalloid  TOURNIQUET TIME: 25 minutes  DRAINS: None  INDICATIONS FOR SURGERY: DEUCE PATERNOSTER is a 73 y.o. year old male with a long history of numbness and paresthesias to the right hand. EMG/nerve conduction studies demonstrated findings consistent with carpal tunnel syndrome.The patient had not seen any significant improvement despite conservative nonsurgical intervention. After discussion of the risks and benefits of surgical intervention, the patient expressed understanding of the risks benefits and agree with plans for carpal tunnel release.   PROCEDURE IN DETAIL: The patient was brought into the operating room and after adequate general anesthesia, a tourniquet was placed on the patient's right upper arm.The right hand and arm were prepped with alcohol and Duraprep and draped in the usual sterile fashion. A "time-out" was performed as per usual protocol. The hand and forearm were exsanguinated using an Esmarch and the tourniquet was inflated to 250 mmHg. Loupe magnification was used throughout the procedure. An incision was made just ulnar to the thenar palmar crease. Dissection was carried down through the palmar fascia to the transverse carpal ligament. The transverse carpal ligament was sharply incised, taking care to protect the underlying structures with the carpal tunnel. Complete release of the transverse carpal ligament was achieved. There was no evidence of ganglion cyst or lipoma within the carpal tunnel. The wound was irrigated with copious amounts of normal saline with antibiotic solution. The skin was then  re-approximated with interrupted sutures of #5-0 nylon. A sterile dressing was applied followed by application of a volar splint. The tourniquet was deflated with a total tourniquet time of 25 minutes.  The patient tolerated the procedure well and was transported to the PACU in stable condition.   P. Holley Bouche., M.D.

## 2019-02-23 NOTE — Transfer of Care (Signed)
Immediate Anesthesia Transfer of Care Note  Patient: Charles Malone  Procedure(s) Performed: CARPAL TUNNEL RELEASE (Right Wrist)  Patient Location: PACU  Anesthesia Type:General  Level of Consciousness: sedated  Airway & Oxygen Therapy: Patient Spontanous Breathing and Patient connected to face mask oxygen  Post-op Assessment: Report given to RN and Post -op Vital signs reviewed and stable  Post vital signs: Reviewed and stable  Last Vitals:  Vitals Value Taken Time  BP 105/57 02/23/19 1707  Temp 36.7 C 02/23/19 1707  Pulse 80 02/23/19 1709  Resp 12 02/23/19 1709  SpO2 99 % 02/23/19 1709  Vitals shown include unvalidated device data.  Last Pain:  Vitals:   02/23/19 1707  TempSrc:   PainSc: 0-No pain         Complications: No apparent anesthesia complications

## 2019-02-23 NOTE — Anesthesia Procedure Notes (Signed)
Procedure Name: LMA Insertion Date/Time: 02/23/2019 4:12 PM Performed by: Dionne Bucy, CRNA Pre-anesthesia Checklist: Patient identified, Patient being monitored, Timeout performed, Emergency Drugs available and Suction available Patient Re-evaluated:Patient Re-evaluated prior to induction Oxygen Delivery Method: Circle system utilized Preoxygenation: Pre-oxygenation with 100% oxygen Induction Type: IV induction Ventilation: Mask ventilation without difficulty LMA: LMA inserted LMA Size: 4.0 Tube type: Oral Number of attempts: 1 Placement Confirmation: positive ETCO2 and breath sounds checked- equal and bilateral Tube secured with: Tape Dental Injury: Teeth and Oropharynx as per pre-operative assessment

## 2019-02-23 NOTE — H&P (Signed)
The patient has been re-examined, and the chart reviewed, and there have been no interval changes to the documented history and physical.    The risks, benefits, and alternatives have been discussed at length. The patient expressed understanding of the risks benefits and agreed with plans for surgical intervention.  James P. Hooten, Jr. M.D.    

## 2019-02-23 NOTE — Anesthesia Preprocedure Evaluation (Signed)
Anesthesia Evaluation  Patient identified by MRN, date of birth, ID band Patient awake    Reviewed: Allergy & Precautions, H&P , NPO status , Patient's Chart, lab work & pertinent test results, reviewed documented beta blocker date and time   History of Anesthesia Complications Negative for: history of anesthetic complications  Airway Mallampati: III  TM Distance: <3 FB Neck ROM: limited    Dental  (+) Caps, Dental Advidsory Given, Chipped   Pulmonary neg pulmonary ROS, neg shortness of breath,    Pulmonary exam normal        Cardiovascular Exercise Tolerance: Good hypertension, (-) angina(-) Past MI and (-) Cardiac Stents Normal cardiovascular exam(-) dysrhythmias (-) Valvular Problems/Murmurs     Neuro/Psych  Neuromuscular disease negative psych ROS   GI/Hepatic negative GI ROS, Neg liver ROS, neg GERD  ,  Endo/Other  diabetes (borderline), Well Controlled, Type 2  Renal/GU negative Renal ROS  negative genitourinary   Musculoskeletal   Abdominal   Peds  Hematology negative hematology ROS (+)   Anesthesia Other Findings Past Medical History: No date: Hand pain No date: Hypertension No date: Polymyalgia rheumatica (HCC) No date: Toothache     Comment:  Root canal on 12/25/2018   Reproductive/Obstetrics negative OB ROS                             Anesthesia Physical  Anesthesia Plan  ASA: II  Anesthesia Plan: General and General LMA   Post-op Pain Management:    Induction: Intravenous  PONV Risk Score and Plan: 2 and Ondansetron, Dexamethasone and Treatment may vary due to age or medical condition  Airway Management Planned: LMA  Additional Equipment:   Intra-op Plan:   Post-operative Plan: Extubation in OR  Informed Consent: I have reviewed the patients History and Physical, chart, labs and discussed the procedure including the risks, benefits and alternatives for the  proposed anesthesia with the patient or authorized representative who has indicated his/her understanding and acceptance.     Dental Advisory Given  Plan Discussed with: Anesthesiologist, CRNA and Surgeon  Anesthesia Plan Comments: (Patient consented for risks of anesthesia including but not limited to:  - adverse reactions to medications - damage to teeth, lips or other oral mucosa - sore throat or hoarseness - Damage to heart, brain, lungs or loss of life  Patient voiced understanding.)        Anesthesia Quick Evaluation

## 2019-02-24 ENCOUNTER — Encounter: Payer: Self-pay | Admitting: Orthopedic Surgery

## 2019-02-24 NOTE — Anesthesia Postprocedure Evaluation (Signed)
Anesthesia Post Note  Patient: Charles Malone  Procedure(s) Performed: CARPAL TUNNEL RELEASE (Right Wrist)  Patient location during evaluation: PACU Anesthesia Type: General Level of consciousness: awake and alert Pain management: pain level controlled Vital Signs Assessment: post-procedure vital signs reviewed and stable Respiratory status: spontaneous breathing, nonlabored ventilation and respiratory function stable Cardiovascular status: blood pressure returned to baseline and stable Postop Assessment: no apparent nausea or vomiting Anesthetic complications: no     Last Vitals:  Vitals:   02/23/19 1736 02/23/19 1746  BP: 115/71 112/68  Pulse:  82  Resp:  18  Temp: 36.6 C 36.5 C  SpO2:  94%    Last Pain:  Vitals:   02/23/19 1746  TempSrc: Temporal  PainSc: 0-No pain                 Durenda Hurt

## 2019-06-02 ENCOUNTER — Other Ambulatory Visit: Payer: Self-pay

## 2019-06-02 ENCOUNTER — Encounter: Payer: Self-pay | Admitting: Occupational Therapy

## 2019-06-02 ENCOUNTER — Ambulatory Visit: Payer: Medicare Other | Attending: Internal Medicine | Admitting: Occupational Therapy

## 2019-06-02 DIAGNOSIS — R600 Localized edema: Secondary | ICD-10-CM | POA: Diagnosis not present

## 2019-06-02 DIAGNOSIS — M6281 Muscle weakness (generalized): Secondary | ICD-10-CM | POA: Insufficient documentation

## 2019-06-02 DIAGNOSIS — M25641 Stiffness of right hand, not elsewhere classified: Secondary | ICD-10-CM

## 2019-06-02 DIAGNOSIS — M25642 Stiffness of left hand, not elsewhere classified: Secondary | ICD-10-CM | POA: Diagnosis present

## 2019-06-02 DIAGNOSIS — G5603 Carpal tunnel syndrome, bilateral upper limbs: Secondary | ICD-10-CM | POA: Insufficient documentation

## 2019-06-02 NOTE — Patient Instructions (Signed)
Contrast 2-3 x day  MC and carpal spreads 10 reps by wife   Tapping of digits - one time - but can do L hand all together 10reps  Tendon glides- keep wrist straight -and composite to one finger out of palm  10reps Opposition to all digits   5 reps each   Med N glide 5 reps  Pain free and light pull  Avoid tight and sustained grip - and small pinch grips Built up handles

## 2019-06-02 NOTE — Therapy (Signed)
[facility] [address] [place], [place], [id number] Phone: [phone]   Fax:  [phone]  Occupational Therapy Evaluation  Patient Details  Name: Charles PuntBraudie R Sagrero MRN: [id number] Date of Birth: 01/29/1946 Referring Provider (OT): Renard MatterBock   Encounter Date: 06/02/2019  OT End of Session - 06/02/19 1332    Visit Number  1    Number of Visits  5    Date for OT Re-Evaluation  07/07/19    OT Start Time  1134    OT Stop Time  1228    OT Time Calculation (min)  54 min    Activity Tolerance  Patient tolerated treatment well    Behavior During Therapy  [facility] for tasks assessed/performed       Past Medical History:  Diagnosis Date  . Hand pain   . Hypertension   . Polymyalgia rheumatica (HCC)   . Pre-diabetes   . Rotator cuff tear   . Toothache    Root canal on 12/25/2018    Past Surgical History:  Procedure Laterality Date  . CARPAL TUNNEL RELEASE Left 12/29/2018   Procedure: CARPAL TUNNEL RELEASE;  Surgeon: Donato HeinzHooten, James P, MD;  Location: [facility] ORS;  Service: Orthopedics;  Laterality: Left;  . CARPAL TUNNEL RELEASE Right 02/23/2019   Procedure: CARPAL TUNNEL RELEASE;  Surgeon: Donato HeinzHooten, James P, MD;  Location: [facility] ORS;  Service: Orthopedics;  Laterality: Right;  . CATARACT EXTRACTION Bilateral   . EYE SURGERY     eye trauma  . PATELLA FRACTURE SURGERY Left   . SEPTOPLASTY      There were no vitals filed for this visit.  Subjective Assessment - 06/02/19 1323    Subjective   My R hand is worse than my L - but they feel swollen,  tight and stiff - and still numbness in my finger tips - cannot make full fist with R hand    Pertinent History  Pt's report having CTS from Jan 2020 - numbness and stiffness - had July L hand CTR , Sept R hand CTR - still numbness and R hand feels tight, stiff and swollen more than L    Patient Stated Goals  I want to be able to use my hands to do all my hobbies like work on watches ,  clocks  , computers, play guitar, shooting target    Currently in Pain?  No/denies        [facility] OT Assessment - 06/02/19 0001      Assessment   Medical Diagnosis  OA hand stiffness, Bil CTR    Referring Provider (OT)  Renard MatterBock    Onset Date/Surgical Date  12/03/18    Hand Dominance  Right      Home  Environment   Lives With  Spouse      Prior Function   Vocation  Retired    Leisure  work on Arts administratorcomputers, clocks, watches, play guitar, shoot target, yardwork ,       Strength   Right Hand Grip (lbs)  45    Right Hand Lateral Pinch  16 lbs    Right Hand 3 Point Pinch  10 lbs    Left Hand Grip (lbs)  48    Left Hand Lateral Pinch  17 lbs    Left Hand 3 Point Pinch  11 lbs      Right Hand AROM   R Thumb Opposition to Index  --   Opposition to side of 5th - pull  on thumb    R Index  MCP 0-90  80 Degrees    R Index PIP 0-100  90 Degrees    R Long  MCP 0-90  80 Degrees    R Long PIP 0-100  85 Degrees    R Ring  MCP 0-90  80 Degrees    R Ring PIP 0-100  85 Degrees    R Little  MCP 0-90  80 Degrees    R Little PIP 0-100  80 Degrees      Left Hand AROM   L Thumb Opposition to Index  --   opposition to 5th tip and side - pull on thumb    L Index  MCP 0-90  85 Degrees    L Index PIP 0-100  90 Degrees    L Long  MCP 0-90  90 Degrees    L Long PIP 0-100  92 Degrees    L Ring  MCP 0-90  90 Degrees    L Ring PIP 0-100  95 Degrees    L Little  MCP 0-90  90 Degrees    L Little PIP 0-100  90 Degrees       contrast done in clinic with soft tissue mobs - CT and MC spreads - pt ed on having wife doing it at home prior to ROM  After contrast   Hand out review and provided:    Contrast 2-3 x day  [facility] and carpal spreads 10 reps by wife   Tapping of digits - one time - but can do L hand all together 10reps  Tendon glides- keep wrist straight -and composite to one finger out of palm  10reps Opposition to all digits   5 reps each   Med N glide 5 reps  Pain free and light pull  Avoid tight  and sustained grip - and small pinch grips Built up handles               OT Education - 06/02/19 1331    Education Details  findings of eval and HEP    Person(s) Educated  Patient    Methods  Explanation;Demonstration;Tactile cues;Verbal cues;Handout    Comprehension  Verbal cues required;Returned demonstration;Verbalized understanding       OT Short Term Goals - 06/02/19 1338      OT SHORT TERM GOAL #1   Title  Pt to show increase AROM in bilateral hands to touch palm with less symptoms of stiffness and tightness    Baseline  R hand PIP's 80-90 , MC 80's in 2nd thru 4th - swollen, tight and stiff feeling - cannot extend digits in R hand    Time  3    Period  Weeks    Status  New    Target Date  06/23/19        OT Long Term Goals - 06/02/19 1340      OT LONG TERM GOAL #1   Title  Pt to be independent in HEP to decrease edema , stiffness and increase AROM and grip strength by 5-10 lbs    Baseline  no knowledge on HEP -and grip R 45, L 48 lbs    Time  5    Period  Weeks    Status  New    Target Date  07/07/19      OT LONG TERM GOAL #2   Title  Pt verbalize understanding of modifications of ADL's and IADL's  to decrease CTS  and stiffness  Baseline  no knowledge -    Time  5    Period  Weeks    Status  New    Target Date  07/07/19            Plan - 06/02/19 1332    Clinical Impression Statement  Pt present to OT eval this date with diagnosis of s/p bilateral CTR in September and July 2020, Polymyalgia rheumatica diagnosed by PCP November 2019 chronic use of steroids, Dr Meda Coffee per pt increased it to 5 mg daily - Dr Meda Coffee refer pt to OT - Primary hand osteoarthritis - pt R hand worse than L - with edema , tightness and stiffness- decrease grip in bilateral hands - and numbness till in thumb to 3rd digits on both hands per pt - limiting his functional use of hands in ADL's and IADL    OT Occupational Profile and History  Problem Focused Assessment - Including  review of records relating to presenting problem    Occupational performance deficits (Please refer to evaluation for details):  ADL's;IADL's;Play;Leisure;Social Participation    Body Structure / Function / Physical Skills  ADL;Decreased knowledge of use of DME;Flexibility;ROM;UE functional use;Sensation;Dexterity;Edema;Strength;IADL    Rehab Potential  Good    Clinical Decision Making  Limited treatment options, no task modification necessary    Comorbidities Affecting Occupational Performance:  None    Modification or Assistance to Complete Evaluation   No modification of tasks or assist necessary to complete eval    OT Frequency  1x / week    OT Duration  --   5 wks   OT Treatment/Interventions  Self-care/ADL training;Therapeutic exercise;Patient/family education;Splinting;Fluidtherapy;Contrast Bath;Ultrasound;DME and/or AE instruction;Manual Therapy    Plan  progress with HEP and adjust as needed    Consulted and Agree with Plan of Care  Patient       Patient will benefit from skilled therapeutic intervention in order to improve the following deficits and impairments:   Body Structure / Function / Physical Skills: ADL, Decreased knowledge of use of DME, Flexibility, ROM, UE functional use, Sensation, Dexterity, Edema, Strength, IADL       Visit Diagnosis: Localized edema - Plan: Ot plan of care cert/re-cert  Carpal tunnel syndrome, bilateral upper limbs - Plan: Ot plan of care cert/re-cert  Stiffness of right hand, not elsewhere classified - Plan: Ot plan of care cert/re-cert  Stiffness of left hand, not elsewhere classified - Plan: Ot plan of care cert/re-cert  Muscle weakness (generalized) - Plan: Ot plan of care cert/re-cert    Problem List Patient Active Problem List   Diagnosis Date Noted  . Bilateral carpal tunnel syndrome 12/14/2018  . PMR (polymyalgia rheumatica) (HCC) 10/14/2018  . Diabetes mellitus without complication ([place]) 40/03/2724  . Other and unspecified  hyperlipidemia 10/14/2013  . Hypertension 12/24/2012  . Astigmatism of both eyes 11/12/2012  . Cataract of right eye 11/12/2012    Rosalyn Gess OTR/L,CLT 06/02/2019, 1:44 PM  [facility] [address] [place], [address] Phone: [phone]   Fax:  [phone]  Name: LASH MATULICH MRN: [id number] Date of Birth: 03-28-1946

## 2019-06-11 ENCOUNTER — Other Ambulatory Visit: Payer: Self-pay

## 2019-06-11 ENCOUNTER — Ambulatory Visit: Payer: Medicare PPO | Attending: Internal Medicine | Admitting: Occupational Therapy

## 2019-06-11 DIAGNOSIS — M25641 Stiffness of right hand, not elsewhere classified: Secondary | ICD-10-CM | POA: Diagnosis present

## 2019-06-11 DIAGNOSIS — M25642 Stiffness of left hand, not elsewhere classified: Secondary | ICD-10-CM | POA: Diagnosis present

## 2019-06-11 DIAGNOSIS — G5603 Carpal tunnel syndrome, bilateral upper limbs: Secondary | ICD-10-CM

## 2019-06-11 DIAGNOSIS — R600 Localized edema: Secondary | ICD-10-CM | POA: Diagnosis not present

## 2019-06-11 DIAGNOSIS — M6281 Muscle weakness (generalized): Secondary | ICD-10-CM | POA: Diagnosis present

## 2019-06-11 NOTE — Patient Instructions (Signed)
Same HEP to do for about another 3 wks and some dexterity HEP added

## 2019-06-11 NOTE — Therapy (Signed)
[facility] [address] [place], [address] Phone: [phone]   Fax:  [phone]  Occupational Therapy Treatment  Patient Details  Name: Charles Malone MRN: [id number] Date of Birth: 1946/03/15 Referring Provider (OT): Meda Coffee   Encounter Date: 06/11/2019  OT End of Session - 06/11/19 1020    Visit Number  2    Number of Visits  5    Date for OT Re-Evaluation  07/07/19    OT Start Time  0945    OT Stop Time  1015    OT Time Calculation (min)  30 min    Activity Tolerance  Patient tolerated treatment well    Behavior During Therapy  [facility] for tasks assessed/performed       Past Medical History:  Diagnosis Date  . Hand pain   . Hypertension   . Polymyalgia rheumatica ([place])   . Pre-diabetes   . Rotator cuff tear   . Toothache    Root canal on 12/25/2018    Past Surgical History:  Procedure Laterality Date  . CARPAL TUNNEL RELEASE Left 12/29/2018   Procedure: CARPAL TUNNEL RELEASE;  Surgeon: Dereck Leep, MD;  Location: [facility] ORS;  Service: Orthopedics;  Laterality: Left;  . CARPAL TUNNEL RELEASE Right 02/23/2019   Procedure: CARPAL TUNNEL RELEASE;  Surgeon: Dereck Leep, MD;  Location: [facility] ORS;  Service: Orthopedics;  Laterality: Right;  . CATARACT EXTRACTION Bilateral   . EYE SURGERY     eye trauma  . PATELLA FRACTURE SURGERY Left   . SEPTOPLASTY      There were no vitals filed for this visit.  Subjective Assessment - 06/11/19 1018    Subjective   My hands do not feel as swollen and tight - still stiff at times - more in the morning - fingers tips still little pins and needles - but can make full fist now    Pertinent History  Pt's report having CTS from Jan 2020 - numbness and stiffness - had July L hand CTR , Sept R hand CTR - still numbness and R hand feels tight, stiff and swollen more than L    Patient Stated Goals  I want to be able to use my hands to do all my hobbies like work on  watches ,  clocks , computers, play guitar, shooting target    Currently in Pain?  No/denies         [facility] OT Assessment - 06/11/19 0001      Strength   Right Hand Grip (lbs)  60    Right Hand Lateral Pinch  17 lbs    Right Hand 3 Point Pinch  12 lbs    Left Hand Grip (lbs)  60    Left Hand Lateral Pinch  17 lbs    Left Hand 3 Point Pinch  13 lbs      Right Hand AROM   R Index  MCP 0-90  85 Degrees    R Index PIP 0-100  96 Degrees    R Long  MCP 0-90  90 Degrees    R Long PIP 0-100  90 Degrees    R Ring  MCP 0-90  90 Degrees    R Ring PIP 0-100  90 Degrees    R Little  MCP 0-90  95 Degrees    R Little PIP 0-100  85 Degrees      Left Hand AROM   L Index  MCP 0-90  85 Degrees    L Index PIP 0-100  90 Degrees    L Long  MCP 0-90  90 Degrees    L Long PIP 0-100  95 Degrees    L Ring  MCP 0-90  90 Degrees    L Ring PIP 0-100  96 Degrees    L Little  MCP 0-90  90 Degrees    L Little PIP 0-100  90 Degrees       Measurements taken - see flowsheet - great progress  Manual pt has less edema in R hand than at eval  Scar not tender     to cont at home with Contrast 2 x day for R hand - but can do heat for L  MC and carpal spreads 10 reps by wife to cont with   Tapping of digits - one time - but can do L hand all together 10reps  Tendon glides- doing better at keeping wrist straight -and composite not to palm   10reps Opposition to all digits   5 reps each   Med N glide 5 reps  Pain free and light pull   Add this date dexterity  4 small 1 cm objects- pick up and move from palm<> finger tips -release one at time  And then 2 cm object - palm to thumb and 2nd/3rd   then palm to 4th /5th finger tips -    Avoid tight and sustained grip - and small pinch grips Built up handles               OT Education - 06/11/19 1019    Education Details  progress - and add dexterity to HEP    Person(s) Educated  Patient    Methods  Explanation;Demonstration;Tactile  cues;Verbal cues;Handout    Comprehension  Verbal cues required;Returned demonstration;Verbalized understanding       OT Short Term Goals - 06/02/19 1338      OT SHORT TERM GOAL #1   Title  Pt to show increase AROM in bilateral hands to touch palm with less symptoms of stiffness and tightness    Baseline  R hand PIP's 80-90 , MC 80's in 2nd thru 4th - swollen, tight and stiff feeling - cannot extend digits in R hand    Time  3    Period  Weeks    Status  New    Target Date  06/23/19        OT Long Term Goals - 06/02/19 1340      OT LONG TERM GOAL #1   Title  Pt to be independent in HEP to decrease edema , stiffness and increase AROM and grip strength by 5-10 lbs    Baseline  no knowledge on HEP -and grip R 45, L 48 lbs    Time  5    Period  Weeks    Status  New    Target Date  07/07/19      OT LONG TERM GOAL #2   Title  Pt verbalize understanding of modifications of ADL's and IADL's  to decrease CTS  and stiffness    Baseline  no knowledge -    Time  5    Period  Weeks    Status  New    Target Date  07/07/19            Plan - 06/11/19 1020    Clinical Impression Statement  Pt made great progress in a week since eval in bilateral  AROM flexion and extention, grip and prehension strength and edema - pt cont to have some stiffness and pins and needles in finger tips- add this date some dexterity HEP -and pt to cont HEP for 3 wks and check back    OT Occupational Profile and History  Problem Focused Assessment - Including review of records relating to presenting problem    Occupational performance deficits (Please refer to evaluation for details):  ADL's;IADL's;Play;Leisure;Social Participation    Body Structure / Function / Physical Skills  ADL;Decreased knowledge of use of DME;Flexibility;ROM;UE functional use;Sensation;Dexterity;Edema;Strength;IADL    Rehab Potential  Good    Clinical Decision Making  Limited treatment options, no task modification necessary     Comorbidities Affecting Occupational Performance:  None    Modification or Assistance to Complete Evaluation   No modification of tasks or assist necessary to complete eval    OT Frequency  --   3 wks   OT Duration  4 weeks    OT Treatment/Interventions  Self-care/ADL training;Therapeutic exercise;Patient/family education;Splinting;Fluidtherapy;Contrast Bath;Ultrasound;DME and/or AE instruction;Manual Therapy    Plan  progress with HEP and possible discharge    OT Home Exercise Plan  see pt instruction    Consulted and Agree with Plan of Care  Patient       Patient will benefit from skilled therapeutic intervention in order to improve the following deficits and impairments:   Body Structure / Function / Physical Skills: ADL, Decreased knowledge of use of DME, Flexibility, ROM, UE functional use, Sensation, Dexterity, Edema, Strength, IADL       Visit Diagnosis: Localized edema  Carpal tunnel syndrome, bilateral upper limbs  Stiffness of right hand, not elsewhere classified  Stiffness of left hand, not elsewhere classified  Muscle weakness (generalized)    Problem List Patient Active Problem List   Diagnosis Date Noted  . Bilateral carpal tunnel syndrome 12/14/2018  . PMR (polymyalgia rheumatica) (HCC) 10/14/2018  . Diabetes mellitus without complication (HCC) 10/12/2016  . Other and unspecified hyperlipidemia 10/14/2013  . Hypertension 12/24/2012  . Astigmatism of both eyes 11/12/2012  . Cataract of right eye 11/12/2012    Oletta Cohn OTR/L,CLT 06/11/2019, 10:23 AM  [facility] [address], [place], [address] Phone: [phone]   Fax:  [phone]  Name: Charles Malone MRN: [id number] Date of Birth: [birth year]

## 2019-07-02 ENCOUNTER — Ambulatory Visit: Payer: Medicare PPO | Admitting: Occupational Therapy

## 2019-07-02 ENCOUNTER — Other Ambulatory Visit: Payer: Self-pay

## 2019-07-02 DIAGNOSIS — M6281 Muscle weakness (generalized): Secondary | ICD-10-CM

## 2019-07-02 DIAGNOSIS — G5603 Carpal tunnel syndrome, bilateral upper limbs: Secondary | ICD-10-CM

## 2019-07-02 DIAGNOSIS — M25642 Stiffness of left hand, not elsewhere classified: Secondary | ICD-10-CM

## 2019-07-02 DIAGNOSIS — R600 Localized edema: Secondary | ICD-10-CM | POA: Diagnosis not present

## 2019-07-02 DIAGNOSIS — M25641 Stiffness of right hand, not elsewhere classified: Secondary | ICD-10-CM

## 2019-07-02 NOTE — Therapy (Signed)
[place] [facility] [address] [place], [address] Phone: [phone]   Fax:  [phone]  Occupational Therapy Treatment/discharge   Patient Details  Name: Charles Malone MRN: [id number] Date of Birth: 10/31/1945 Referring Provider (OT): Meda Coffee   Encounter Date: 07/02/2019  OT End of Session - 07/02/19 0939    Visit Number  3    Number of Visits  3    Date for OT Re-Evaluation  07/02/19    OT Start Time  0900    OT Stop Time  0929    OT Time Calculation (min)  29 min    Activity Tolerance  Patient tolerated treatment well    Behavior During Therapy  [facility] for tasks assessed/performed       Past Medical History:  Diagnosis Date  . Hand pain   . Hypertension   . Polymyalgia rheumatica ([place])   . Pre-diabetes   . Rotator cuff tear   . Toothache    Root canal on 12/25/2018    Past Surgical History:  Procedure Laterality Date  . CARPAL TUNNEL RELEASE Left 12/29/2018   Procedure: CARPAL TUNNEL RELEASE;  Surgeon: Dereck Leep, MD;  Location: [facility] ORS;  Service: Orthopedics;  Laterality: Left;  . CARPAL TUNNEL RELEASE Right 02/23/2019   Procedure: CARPAL TUNNEL RELEASE;  Surgeon: Dereck Leep, MD;  Location: [facility] ORS;  Service: Orthopedics;  Laterality: Right;  . CATARACT EXTRACTION Bilateral   . EYE SURGERY     eye trauma  . PATELLA FRACTURE SURGERY Left   . SEPTOPLASTY      There were no vitals filed for this visit.  Subjective Assessment - 07/02/19 0936    Subjective   My hands feel great - can make tight fist - only stiff in the morning - but gets better as the day goes - still some numbness in hands over medial nerve - but I can feel objects - I can tell it is getting better    Pertinent History  Pt's report having CTS from Jan 2020 - numbness and stiffness - had July L hand CTR , Sept R hand CTR - still numbness and R hand feels tight, stiff and swollen more than L    Patient Stated Goals  I want to  be able to use my hands to do all my hobbies like work on watches ,  clocks , computers, play guitar, shooting target    Currently in Pain?  No/denies         [facility] OT Assessment - 07/02/19 0001      Strength   Right Hand Grip (lbs)  60    Right Hand Lateral Pinch  18 lbs    Right Hand 3 Point Pinch  12 lbs    Left Hand Grip (lbs)  60    Left Hand Lateral Pinch  18 lbs    Left Hand 3 Point Pinch  13 lbs      Right Hand AROM   R Index  MCP 0-90  85 Degrees    R Index PIP 0-100  95 Degrees    R Long  MCP 0-90  90 Degrees    R Long PIP 0-100  95 Degrees    R Ring  MCP 0-90  90 Degrees    R Ring PIP 0-100  100 Degrees    R Little  MCP 0-90  95 Degrees    R Little PIP 0-100  92 Degrees  Left Hand AROM   L Index  MCP 0-90  90 Degrees    L Index PIP 0-100  100 Degrees    L Long  MCP 0-90  90 Degrees    L Long PIP 0-100  100 Degrees    L Ring  MCP 0-90  90 Degrees    L Ring PIP 0-100  97 Degrees    L Little  MCP 0-90  90 Degrees    L Little PIP 0-100  90 Degrees      Pt made great progress from [facility] - this past week increase again in flexion of digits  And extention no easier - still not hyper extention at [facility] Pt can do rolling of putty for soft tissue mobs over volar digits , palm and wrist At home - but no grip strengthening yet - still some sensation changes over bilateral Med N - but improving - and can feel light touch and textures  Pt to cont with heat for stiffness , tendon glides AROM  And opposition  As well as MEd N glides until sensation is normal  Dexterity WNL - done some fingers <> hand manipulation of 5 small objects no issues And Opposition to 2nd and 3rd , to palm  And opposition to 4th and 5th to palm  No issues   pt to cont with HEP - discharge at this time  Can contact me if needed                  OT Education - 07/02/19 0939    Education Details  progress and discharge instructions    Person(s) Educated  Patient    Methods   Explanation;Demonstration;Tactile cues;Verbal cues;Handout    Comprehension  Verbal cues required;Returned demonstration;Verbalized understanding       OT Short Term Goals - 07/02/19 0942      OT SHORT TERM GOAL #1   Title  Pt to show increase AROM in bilateral hands to touch palm with less symptoms of stiffness and tightness    Baseline  touch palm - close to WNL - no increase symptoms    Status  Achieved        OT Long Term Goals - 07/02/19 0943      OT LONG TERM GOAL #1   Title  Pt to be independent in HEP to decrease edema , stiffness and increase AROM and grip strength by 5-10 lbs    Baseline  no knowledge on HEP -and grip R 45, L 48 lbs- decrease stiffness , and edema - and grip increase to 60 lbs R and L    Status  Achieved      OT LONG TERM GOAL #2   Title  Pt verbalize understanding of modifications of ADL's and IADL's  to decrease CTS  and stiffness    Status  Achieved            Plan - 07/02/19 0940    Clinical Impression Statement  Pt made great progress in 3 visits in bilateral AORM flexion and extentoin , grip and prehension strength- -pt cont to have some pins and needles over MEd N but can feel objects and light textures- pt better with dexterity - good strength in thumb PA and RA - pt discharge with HEP - still to hold off on grip strenghtening and prehension until sensation is WNL    OT Occupational Profile and History  Problem Focused Assessment - Including review of records relating to presenting problem  Occupational performance deficits (Please refer to evaluation for details):  ADL's;IADL's;Play;Leisure;Social Participation    Body Structure / Function / Physical Skills  ADL;Decreased knowledge of use of DME;Flexibility;ROM;UE functional use;Sensation;Dexterity;Edema;Strength;IADL    Rehab Potential  Good    Clinical Decision Making  Limited treatment options, no task modification necessary    Comorbidities Affecting Occupational Performance:  None     Modification or Assistance to Complete Evaluation   No modification of tasks or assist necessary to complete eval    Plan  discharge with HEP    OT Home Exercise Plan  see pt instruction    Consulted and Agree with Plan of Care  Patient       Patient will benefit from skilled therapeutic intervention in order to improve the following deficits and impairments:   Body Structure / Function / Physical Skills: ADL, Decreased knowledge of use of DME, Flexibility, ROM, UE functional use, Sensation, Dexterity, Edema, Strength, IADL       Visit Diagnosis: Localized edema  Carpal tunnel syndrome, bilateral upper limbs  Stiffness of right hand, not elsewhere classified  Stiffness of left hand, not elsewhere classified  Muscle weakness (generalized)    Problem List Patient Active Problem List   Diagnosis Date Noted  . Bilateral carpal tunnel syndrome 12/14/2018  . PMR (polymyalgia rheumatica) (HCC) 10/14/2018  . Diabetes mellitus without complication (HCC) 10/12/2016  . Other and unspecified hyperlipidemia 10/14/2013  . Hypertension 12/24/2012  . Astigmatism of both eyes 11/12/2012  . Cataract of right eye 11/12/2012    Oletta Cohn OTR/L,CLT 07/02/2019, 9:44 AM  [facility] SP[facility] MEDIC[facility] [address] [place], [address] Phone: [phone]   Fax:  [phone]  Name: Charles Malone MRN: [id number] Date of Birth: Nov 25, 1945

## 2019-07-02 NOTE — Patient Instructions (Signed)
Cont with heat in the am - tendon glides  Opposition  Med N glides - 5 reps  Hold off on grip strengthening - until sensation is back to normal  Can do some rolling over putty - for soft tissue mobs to volar hand and wrist

## 2020-08-22 ENCOUNTER — Emergency Department: Payer: Medicare PPO

## 2020-08-22 ENCOUNTER — Other Ambulatory Visit: Payer: Self-pay

## 2020-08-22 ENCOUNTER — Emergency Department
Admission: EM | Admit: 2020-08-22 | Discharge: 2020-08-22 | Disposition: A | Discharge status: Home / Self Care | Payer: Medicare PPO | Attending: Emergency Medicine | Admitting: Emergency Medicine

## 2020-08-22 DIAGNOSIS — S0990XA Unspecified injury of head, initial encounter: Secondary | ICD-10-CM | POA: Diagnosis present

## 2020-08-22 DIAGNOSIS — S0101XA Laceration without foreign body of scalp, initial encounter: Secondary | ICD-10-CM

## 2020-08-22 DIAGNOSIS — Z23 Encounter for immunization: Secondary | ICD-10-CM | POA: Insufficient documentation

## 2020-08-22 DIAGNOSIS — M25511 Pain in right shoulder: Secondary | ICD-10-CM | POA: Insufficient documentation

## 2020-08-22 DIAGNOSIS — E119 Type 2 diabetes mellitus without complications: Secondary | ICD-10-CM | POA: Diagnosis not present

## 2020-08-22 DIAGNOSIS — I10 Essential (primary) hypertension: Secondary | ICD-10-CM | POA: Insufficient documentation

## 2020-08-22 DIAGNOSIS — M25512 Pain in left shoulder: Secondary | ICD-10-CM | POA: Insufficient documentation

## 2020-08-22 DIAGNOSIS — W2209XA Striking against other stationary object, initial encounter: Secondary | ICD-10-CM | POA: Insufficient documentation

## 2020-08-22 DIAGNOSIS — Z79899 Other long term (current) drug therapy: Secondary | ICD-10-CM | POA: Insufficient documentation

## 2020-08-22 DIAGNOSIS — Y92017 Garden or yard in single-family (private) house as the place of occurrence of the external cause: Secondary | ICD-10-CM | POA: Diagnosis not present

## 2020-08-22 MED ORDER — BACITRACIN ZINC 500 UNIT/GM EX OINT
TOPICAL_OINTMENT | Freq: Once | CUTANEOUS | Status: AC
  Filled 2020-08-22: qty 0.9

## 2020-08-22 MED ORDER — TETANUS-DIPHTH-ACELL PERTUSSIS 5-2.5-18.5 LF-MCG/0.5 IM SUSY
0.5000 mL | PREFILLED_SYRINGE | Freq: Once | INTRAMUSCULAR | Status: AC
  Administered 2020-08-22: 0.5 mL via INTRAMUSCULAR
  Filled 2020-08-22: qty 0.5

## 2020-08-22 NOTE — ED Triage Notes (Signed)
Pt states he was on his riding lawn mowers and was mowing under his deck and lifted his head and hit it on a beam, pt has a lac with controlled bleeding denies LOC, pt c/o neck and shoulder pain.

## 2020-08-22 NOTE — Discharge Instructions (Addendum)
As we discussed, on your fifth cervical vertebrae, there is a possible fracture on the left side.  Due to lack of pain at this area and full mobility of your neck, we decided to have you follow-up with the neurosurgeons in the clinic.  I have attached the phone number for Dr. Adriana Simas to facilitate this.  If you develop any worsening pain to the left side of your neck, difficulty using her arms, please return to the ED immediately.  Use Tylenol for pain and fevers.  Up to 1000 mg per dose, up to 4 times per day.  Do not take more than 4000 mg of Tylenol/acetaminophen within 24 hours..  In general, keep the area clean and dry. Then apply a Bacitracin/Neosporin type of antibiotic ointment.  Gently wash with warm soap and water once daily, and again if it gets dirty. Don't vigorously scrub at the wound.  Gently pat dry. Once dry, apply Neosporin / bacitracin type antibiotic ointment. It is important to keep the wound moist with an antibiotic ointment, or a small amount of Vaseline, for the first 5 days. Keeping it moist/clean will help the area heal faster and prevent scar tissue formation.  If you are doing anything active, keep it covered.  If you are sitting around at home, you may leave it uncovered.  Those 5 staples in your scalp will need to be removed in about 7-10 days.  This can be done at any urgent care, doctors office or out front in the ED.

## 2020-08-22 NOTE — ED Provider Notes (Signed)
[facility] Emergency Department Provider Note ____________________________________________   Event Date/Time   First MD Initiated Contact with Patient 08/22/20 1942     (approximate)  I have reviewed the triage vital signs and the nursing notes.  HISTORY  Chief Complaint Laceration and Neck Pain   HPI Charles Malone is a 75 y.o. malewho presents to the ED for evaluation of head pain and laceration.  Chart review indicates history of hypertension.  No anticoagulation.  Patient does to the ED with his son, who provides additional history, for evaluation of scalp laceration after an accidental head injury.  Patient reports he was riding his lawnmower beneath a wooden deck, when he accidentally started rolling forward, so he reflexively flexed his neck backwards striking his vertex scalp on the underside of flooring truss wood, causing injury.  Patient reports this happened about 2-3 hours prior to my evaluation.  They report immediate blood in the semen, now improving with direct pressure.  Patient reports increasing tightness and pain to his bilateral shoulders over the past hour, but denies any discrete neck pain.  Reports full mobility of his neck without neck pain.  Denies syncope, emesis or additional trauma.    Past Medical History:  Diagnosis Date  . Hand pain   . Hypertension   . Polymyalgia rheumatica (HCC)   . Pre-diabetes   . Rotator cuff tear   . Toothache    Root canal on 12/25/2018    Patient Active Problem List   Diagnosis Date Noted  . Bilateral carpal tunnel syndrome 12/14/2018  . PMR (polymyalgia rheumatica) (HCC) 10/14/2018  . Diabetes mellitus without complication (HCC) 10/12/2016  . Other and unspecified hyperlipidemia 10/14/2013  . Hypertension 12/24/2012  . Astigmatism of both eyes 11/12/2012  . Cataract of right eye 11/12/2012    Past Surgical History:  Procedure Laterality Date  . CARPAL TUNNEL RELEASE Left 12/29/2018    Procedure: CARPAL TUNNEL RELEASE;  Surgeon: Donato Heinz, MD;  Location: [facility] ORS;  Service: Orthopedics;  Laterality: Left;  . CARPAL TUNNEL RELEASE Right 02/23/2019   Procedure: CARPAL TUNNEL RELEASE;  Surgeon: Donato Heinz, MD;  Location: [facility] ORS;  Service: Orthopedics;  Laterality: Right;  . CATARACT EXTRACTION Bilateral   . EYE SURGERY     eye trauma  . PATELLA FRACTURE SURGERY Left   . SEPTOPLASTY      Prior to Admission medications   Medication Sig Start Date End Date Taking? Authorizing Provider  HYDROcodone-acetaminophen (NORCO) 5-325 MG tablet Take 1-2 tablets by mouth every 4 (four) hours as needed for moderate pain. Patient not taking: Reported on 02/10/2019 12/29/18   Donato Heinz, MD  losartan-hydrochlorothiazide (HYZAAR) 50-12.5 MG tablet Take 1 tablet by mouth daily. 10/13/18   Irean Hong, MD  predniSONE (DELTASONE) 1 MG tablet Take 5 mg by mouth daily with breakfast.     [provider]    Allergies Penicillins and Lisinopril  No family history on file.  Social History Social History   Tobacco Use  . Smoking status: Never Smoker  . Smokeless tobacco: Never Used  Vaping Use  . Vaping Use: Never used  Substance Use Topics  . Alcohol use: Never  . Drug use: Never    Review of Systems  Constitutional: No fever/chills Eyes: No visual changes. ENT: No sore throat. Cardiovascular: Denies chest pain. Respiratory: Denies shortness of breath. Gastrointestinal: No abdominal pain.  No nausea, no vomiting.  No diarrhea.  No constipation. Genitourinary: Negative for dysuria.  Musculoskeletal: Negative for back pain.  Positive for bilateral shoulder tightness and pain. Skin: Negative for rash. Neurological: Negative for  focal weakness or numbness.  Positive for headache and head injury  ____________________________________________   PHYSICAL EXAM:  VITAL SIGNS: Vitals:   08/22/20 1809  BP: 133/81  Pulse: 99  Temp: 97.8 F (36.6 C)  SpO2:  96%    Constitutional: Alert and oriented. Well appearing and in no acute distress.  Pleasant and conversational in full sentences. Eyes: Conjunctivae are normal. PERRL. EOMI. Head: Horizontal laceration at midline of the vertex, about 5 cm in length into the subcutaneous tissue.  Hemostatic with direct pressure.  No associated bony step-offs. Nose: No congestion/rhinnorhea. Mouth/Throat: Mucous membranes are moist.  Oropharynx non-erythematous. Neck: No stridor. No cervical spine tenderness to palpation. No midline cervical spinal tenderness and no tenderness above C7 to the neck.   He does have some diffuse and poorly localizing bilateral tenderness of his trapezius musculature, with a palpable muscular cord in the right side reproducing his bilateral shoulder tightness and pain. Cardiovascular: Normal rate, regular rhythm. Grossly normal heart sounds.  Good peripheral circulation. Respiratory: Normal respiratory effort.  No retractions. Lungs CTAB. Gastrointestinal: Soft , nondistended, nontender to palpation. No CVA tenderness. Musculoskeletal: No lower extremity tenderness nor edema.  No joint effusions. No signs of acute trauma. Neurologic:  Normal speech and language. No gross focal neurologic deficits are appreciated. No gait instability noted. Cranial nerves II through XII intact 5/5 strength and sensation in all 4 extremities Skin:  Skin is warm, dry and intact. No rash noted. Psychiatric: Mood and affect are normal. Speech and behavior are normal.   ____________________________________________  RADIOLOGY  ED MD interpretation: CT head reviewed by me without evidence of acute intracranial pathology. CT C-spine reviewed by me with possible left-sided C5 fracture  Official radiology report(s): CT Head Wo Contrast  Result Date: 08/22/2020 CLINICAL DATA:  Hit head EXAM: CT HEAD WITHOUT CONTRAST TECHNIQUE: Contiguous axial images were obtained from the base of the skull through  the vertex without intravenous contrast. COMPARISON:  None. FINDINGS: Brain: Age related volume loss. No acute intracranial abnormality. Specifically, no hemorrhage, hydrocephalus, mass lesion, acute infarction, or significant intracranial injury. Vascular: No hyperdense vessel or unexpected calcification. Skull: No acute calvarial abnormality. Sinuses/Orbits: Visualized paranasal sinuses and mastoids clear. Orbital soft tissues unremarkable. Other: None IMPRESSION: No acute intracranial abnormality. Electronically Signed   By: Charlett NoseKevin  Dover M.D.   On: 08/22/2020 19:00   CT Cervical Spine Wo Contrast  Result Date: 08/22/2020 CLINICAL DATA:  Hit head EXAM: CT CERVICAL SPINE WITHOUT CONTRAST TECHNIQUE: Multidetector CT imaging of the cervical spine was performed without intravenous contrast. Multiplanar CT image reconstructions were also generated. COMPARISON:  None. FINDINGS: Alignment: Traumatic anterolisthesis of C5 on C6 measures 2-3 mm. Skull base and vertebrae: There is a fracture noted through the left C5 pedicle and left posterior C5 lamina. The laminar fracture appears to have underlying sclerotic margins. These fractures are age indeterminate. The left C5 /facet is nearly perched. Soft tissues and spinal canal: Multilevel spinal canal narrowing, most notable at C4-5 and C5-6 related to the trauma as well as posterior spurs. Prevertebral soft tissues are normal. No paraspinal hematoma. Disc levels: Extensive spurring and advanced degenerative disc disease as well as advanced degenerate facet disease throughout the cervical spine. Upper chest: No acute findings Other: None IMPRESSION: Age-indeterminate fractures through the left C5 pedicle and lamina. Recommend clinical correlation for any prior history of neck injury. Without prior history,  the should be assumed acute. MRI may be helpful for further delineation if felt clinically indicated. Advanced degenerative disc and facet disease. Multilevel central  canal stenosis. Critical Value/emergent results were called by telephone at the time of interpretation on 08/22/2020 at 7:14 pm to provider [facility] , who verbally acknowledged these results. Electronically Signed   By: Charlett Nose M.D.   On: 08/22/2020 19:18    ____________________________________________   PROCEDURES and INTERVENTIONS  Procedure(s) performed (including Critical Care):  Marland KitchenMarland KitchenLaceration Repair  Date/Time: 08/22/2020 8:57 PM Performed by: Delton Prairie, MD Authorized by: Delton Prairie, MD   Consent:    Consent obtained:  Verbal   Consent given by:  Patient   Risks, benefits, and alternatives were discussed: yes     Risks discussed:  Pain, need for additional repair, poor cosmetic result, retained foreign body and infection Anesthesia:    Anesthesia method:  None Laceration details:    Location:  Scalp   Scalp location:  Crown   Length (cm):  5 Exploration:    Limited defect created (wound extended): no     Hemostasis achieved with:  Direct pressure   Imaging obtained comment:  CT   Imaging outcome: foreign body not noted     Contaminated: no   Treatment:    Area cleansed with:  Chlorhexidine   Amount of cleaning:  Standard   Irrigation solution:  Sterile saline   Irrigation method:  Pressure wash   Visualized foreign bodies/material removed: no     Debridement:  None   Undermining:  None   Scar revision: no   Skin repair:    Repair method:  Staples   Number of staples:  5 Approximation:    Approximation:  Close Repair type:    Repair type:  Simple Post-procedure details:    Dressing:  Antibiotic ointment   Procedure completion:  Tolerated well, no immediate complications    Medications  bacitracin ointment (has no administration in time range)  Tdap (BOOSTRIX) injection 0.5 mL (has no administration in time range)    ____________________________________________   MDM / ED COURSE  75 year old male presents to the ED with scalp laceration  after accidental head injury, requiring staple repair and amenable to outpatient management thereafter.  Normal vitals.  Exam with hemostatic horizontal laceration to the vertex of his scalp.  No evidence of galeal disruption on examination.  No neurovascular deficits or any distress.  He has no cervical spinal tenderness to palpation and is not complaining of cervical neck pain.  CT imaging of head and neck reviewed without evidence of acute ICH, and with possible left-sided C5 fracture.  I discussed this with the patient, and the son at the bedside, and shared decision-making yields plan to follow-up with neurosurgery in the clinic without Aspen collar (which was offered).  Laceration repair, as above, uncomplicated with staples.  Will provide Tdap booster and bacitracin.  We discussed staple repair and we discussed following up with neurosurgery.  We discussed return precautions for the ED and patient is stable for outpatient management.  Clinical Course as of 08/22/20 2058  Mon Aug 22, 2020  2049 Lac repair complete, well-tolerated.  I discussed with patient and son wound care recommendations and staple removal as I am performing this.  Answered questions.  We discussed return precautions for the ED.  We discussed following up with neurosurgery. [DS]    Clinical Course User Index [DS] Delton Prairie, MD    ____________________________________________   FINAL CLINICAL IMPRESSION(S) / ED  DIAGNOSES  Final diagnoses:  Scalp laceration, initial encounter  Injury of head, initial encounter     ED Discharge Orders    None       Izaiha Lo   Note:  This document was prepared using Dragon voice recognition software and may include unintentional dictation errors.   Delton Prairie, MD 08/22/20 2059

## 2022-07-07 IMAGING — CT CT CERVICAL SPINE W/O CM
3 of 4 series · 12 of 33 positions shown, 14 images · non-contrast
Comparison: None.

CLINICAL DATA: Hit head

EXAM:
CT CERVICAL SPINE WITHOUT CONTRAST
TECHNIQUE: Multidetector CT imaging of the cervical spine was performed without
intravenous contrast. Multiplanar CT image reconstructions were also
generated.

[Series 4: sagittal bone · sagittal · 0.27mm/px · 5 of 76 slices shown, 6 images]
[im 26/76  bone]
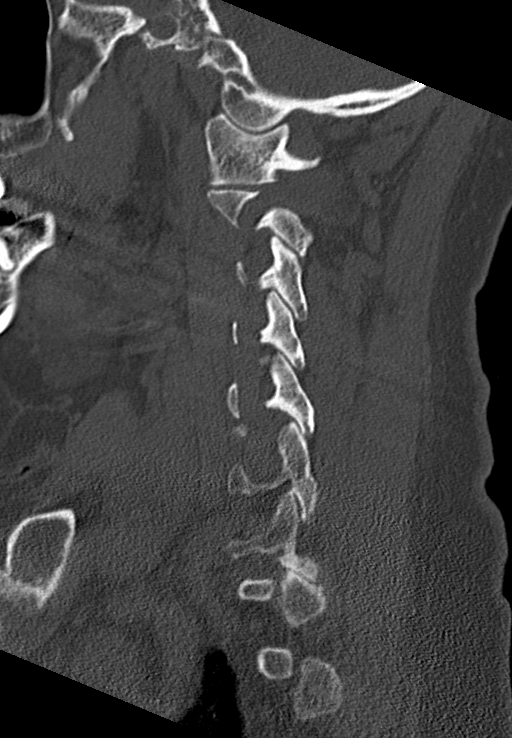
[im 32/76  bone]
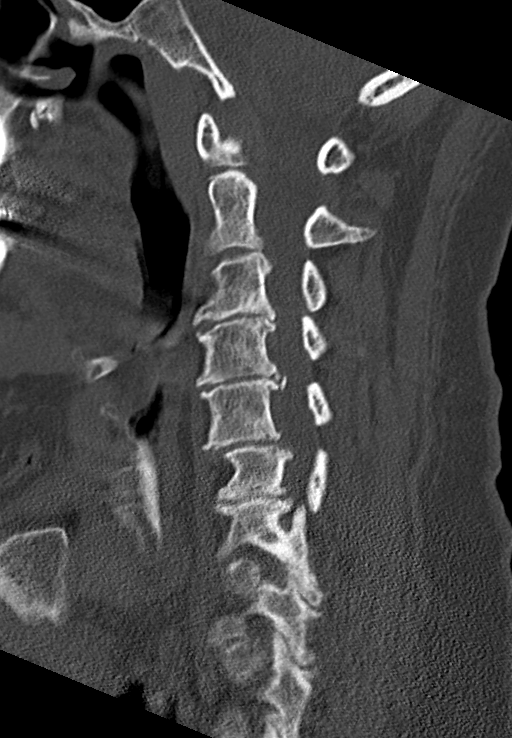
[im 38/76  soft-tissue]
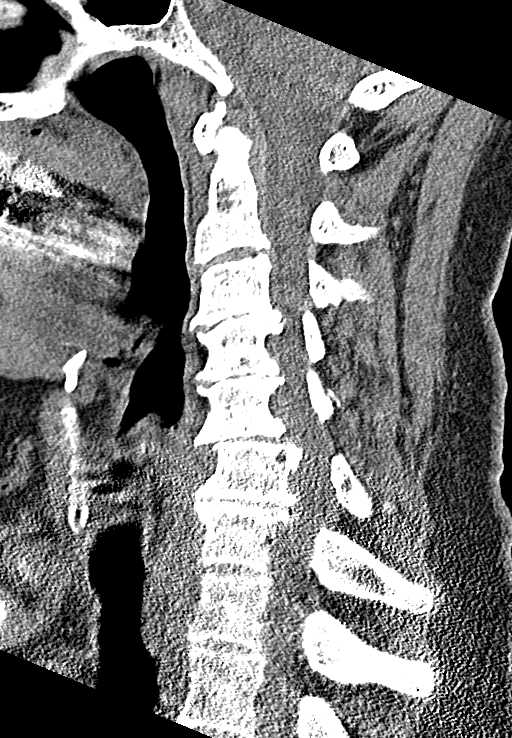
[im 38/76  bone]
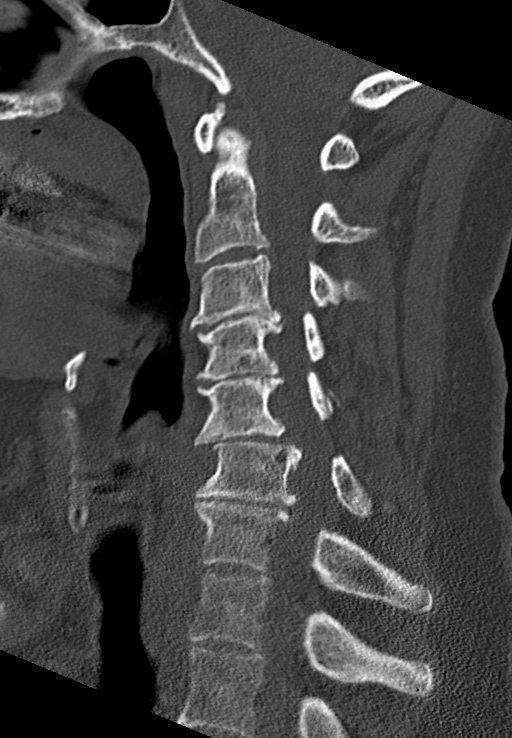
[im 44/76  bone]
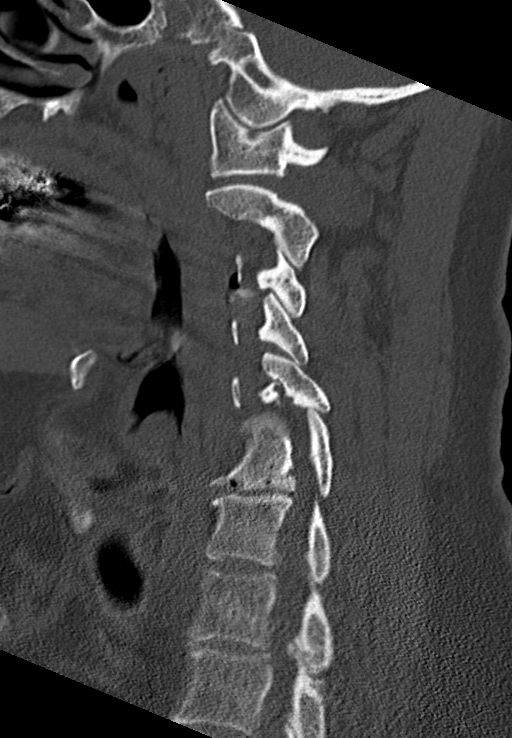
[im 51/76  bone]
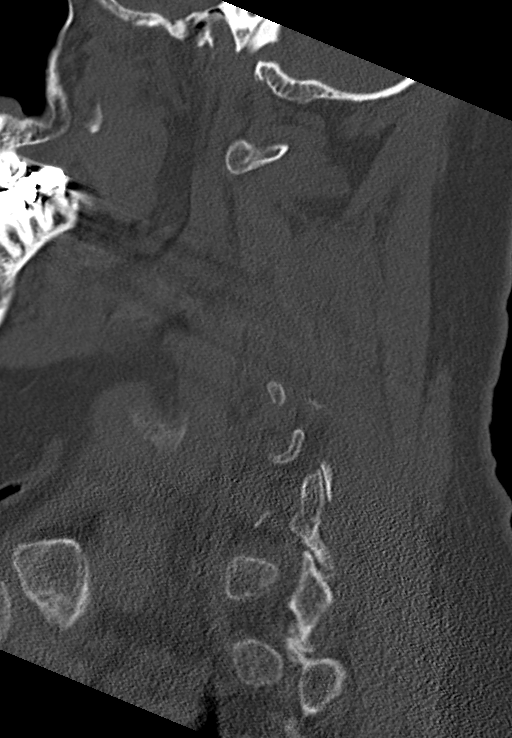

[Series 5: coronal bone · coronal · 0.29mm/px · 3 of 70 slices shown]
[im 16/70  bone]
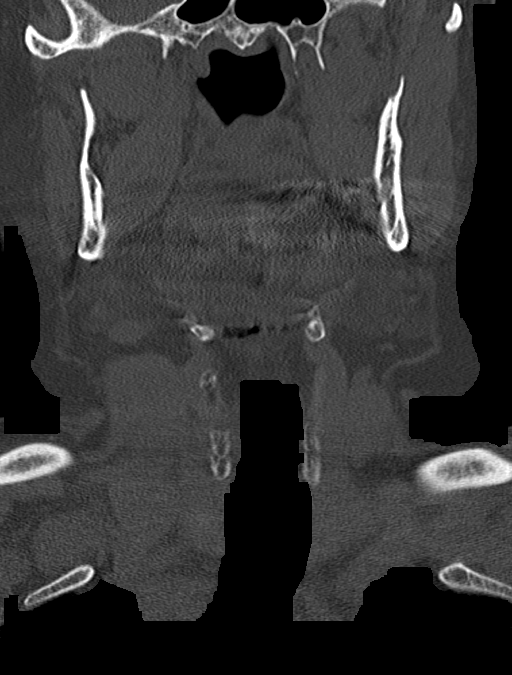
[im 29/70  bone]
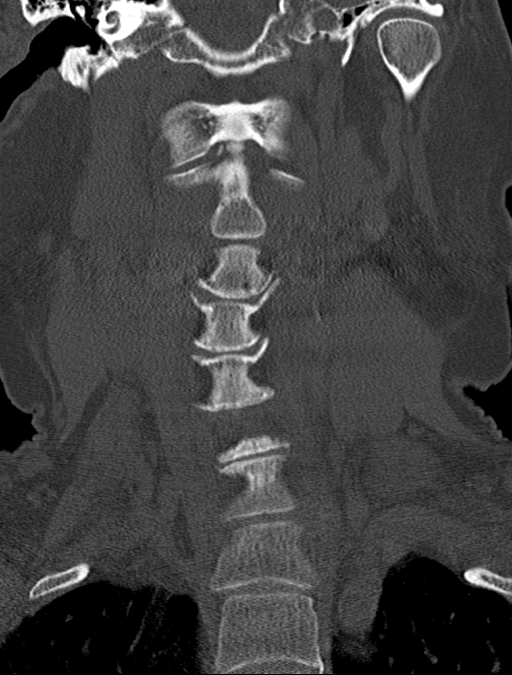
[im 42/70  bone]
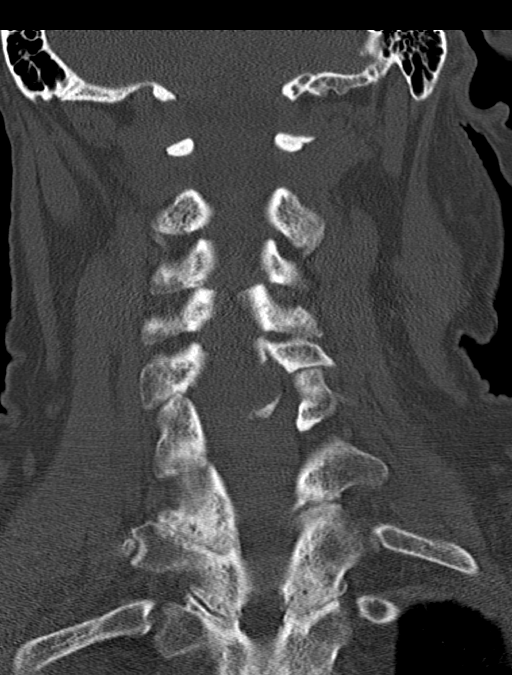

[Series 6: orthogonal bone · axial · 0.27mm/px · z∈[-283,-161]mm · 4 of 100 slices shown, 5 images]
[im 17/100  soft-tissue]
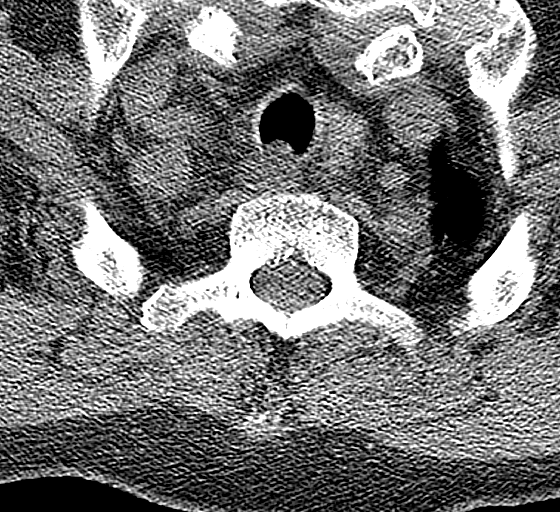
[im 17/100  bone]
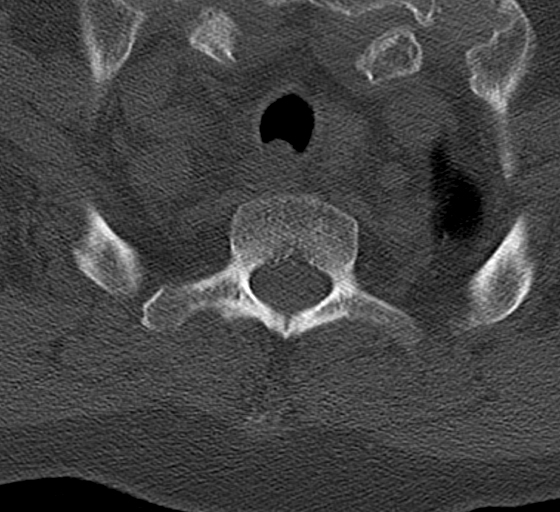
[im 34/100  bone]
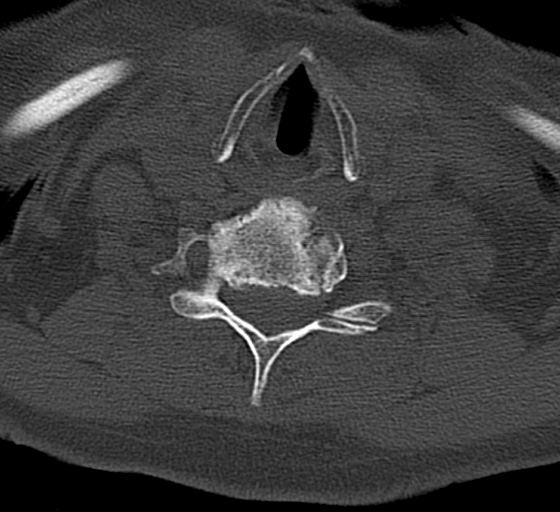
[im 67/100  bone]
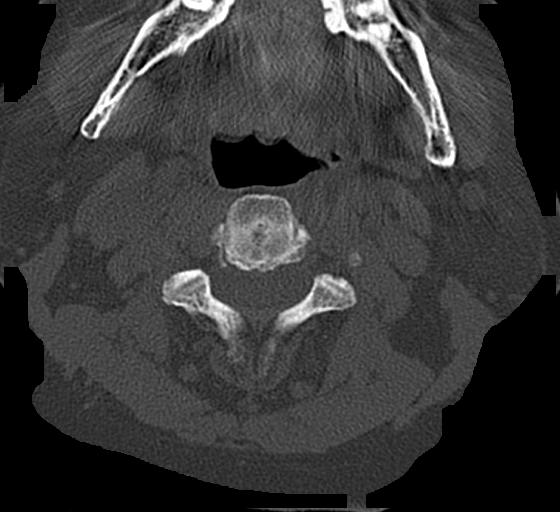
[im 83/100  bone]
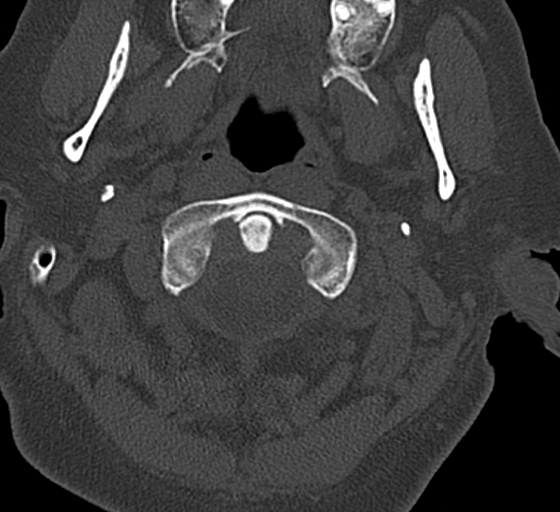

[12 of 33 positions shown; findings below may reference images not displayed]

FINDINGS: Alignment: Traumatic anterolisthesis of C5 on C6 measures 2-3 mm.

Skull base and vertebrae: There is a fracture noted through the left
C5 pedicle and left posterior C5 lamina. The laminar fracture
appears to have underlying sclerotic margins. These fractures are
age indeterminate. The left C5 /facet is nearly perched.

Soft tissues and spinal canal: Multilevel spinal canal narrowing,
most notable at C4-5 and C5-6 related to the trauma as well as
posterior spurs. Prevertebral soft tissues are normal. No paraspinal
hematoma.

Disc levels: Extensive spurring and advanced degenerative disc
disease as well as advanced degenerate facet disease throughout the
cervical spine.

Upper chest: No acute findings

Other: None
IMPRESSION: Age-indeterminate fractures through the left C5 pedicle and lamina.
Recommend clinical correlation for any prior history of neck injury.
Without prior history, the should be assumed acute. MRI may be
helpful for further delineation if felt clinically indicated.

Advanced degenerative disc and facet disease. Multilevel central
canal stenosis.

Critical Value/emergent results were called by telephone at the time
of interpretation on 08/22/2020 at [DATE] to provider HUOSEN BOKAN
, who verbally acknowledged these results.
